# Patient Record
Sex: Female | Born: 1935
Health system: Southern US, Community
[De-identification: ages and names within clinical notes are randomized; demographics above are authoritative.]

## PROBLEM LIST (undated history)

## (undated) DIAGNOSIS — H9319 Tinnitus, unspecified ear: Secondary | ICD-10-CM

## (undated) DIAGNOSIS — F419 Anxiety disorder, unspecified: Secondary | ICD-10-CM

## (undated) DIAGNOSIS — I10 Essential (primary) hypertension: Secondary | ICD-10-CM

## (undated) DIAGNOSIS — K219 Gastro-esophageal reflux disease without esophagitis: Secondary | ICD-10-CM

## (undated) DIAGNOSIS — E079 Disorder of thyroid, unspecified: Secondary | ICD-10-CM

## (undated) DIAGNOSIS — K589 Irritable bowel syndrome without diarrhea: Secondary | ICD-10-CM

## (undated) DIAGNOSIS — R42 Dizziness and giddiness: Secondary | ICD-10-CM

## (undated) DIAGNOSIS — E785 Hyperlipidemia, unspecified: Secondary | ICD-10-CM

## (undated) HISTORY — PX: OTHER SURGICAL HISTORY: SHX169

## (undated) HISTORY — DX: Gastro-esophageal reflux disease without esophagitis: K21.9

## (undated) HISTORY — DX: Irritable bowel syndrome, unspecified: K58.9

## (undated) HISTORY — DX: Disorder of thyroid, unspecified: E07.9

## (undated) HISTORY — DX: Essential (primary) hypertension: I10

## (undated) HISTORY — DX: Tinnitus, unspecified ear: H93.19

## (undated) HISTORY — DX: Hyperlipidemia, unspecified: E78.5

## (undated) HISTORY — DX: Anxiety disorder, unspecified: F41.9

## (undated) HISTORY — PX: ABDOMINAL HYSTERECTOMY: SHX81

---

## 2001-05-13 ENCOUNTER — Emergency Department (HOSPITAL_COMMUNITY): Admission: EM | Admit: 2001-05-13 | Discharge: 2001-05-13 | Payer: Self-pay

## 2008-12-14 ENCOUNTER — Ambulatory Visit: Payer: Self-pay | Admitting: Cardiology

## 2008-12-31 ENCOUNTER — Ambulatory Visit: Payer: Self-pay | Admitting: Cardiology

## 2011-01-30 NOTE — Assessment & Plan Note (Signed)
[facility]                          [facility] OFFICE NOTE   JENIYA, FLANNIGAN                 MRN:          [id number]  DATE:12/31/2008                            DOB:          03-21-1933    PRIMARY CARE Jolyne Laye:  Lindaann Pascal, PAC   REASON FOR VISIT:  Followup hospital consultation.   HISTORY OF PRESENT ILLNESS:  [name] is a 77 year old woman seen  back in late March during the hospital consultation for palpitations.  She has been treated for hypertension with the recent addition of  felodipine by her primary care Jaxten Brosh and to that we added low-dose  metoprolol, which has significantly helped her sense of palpitations.  During her hospital stay, she had no significant dysrhythmias noted.  Her electrocardiogram shows an interventricular conduction delay  (question incomplete left bundle-branch block pattern), and she did  undergo an echocardiogram demonstrating overall normal left ventricular  ejection fraction of 55-60% with mitral bowing, but no frank prolapse  associated with posteriorly directed mitral regurgitation.  She  underwent a screening Cardiolite study, which demonstrated equivocal  left ventricular perfusion with probable variable breast attenuation  artifact leading to a partially reversible anteroapical defect with  overall ejection fraction of 62%.  She has not had frank angina and has  been managed medically including the addition of aspirin.  [name]  states that she has been very anxious about her blood pressure and when  she checks it at home, sometimes her systolics are in the range of 160-  170, but down as low as 130.  She has been tolerating her medications  by report.  She also states that she uses p.r.n. Xanax, which helps her  some.  She has had no further sense of palpitations and has had no  dizziness or syncope.   ALLERGIES:  No known drug allergies.   PRESENT MEDICATIONS:  1. Aspirin  81 mg p.o. daily.  2. Synthroid 88 mcg 1 daily on Monday, Wednesday, and Friday and 0.5      on the other days of the week.  3. Lovastatin 20 mg p.o. q.a.m.  4. Felodipine ER 2.5 mg p.o. q.a.m.  5. Metoprolol 25 mg one-half tablet p.o. b.i.d.   REVIEW OF SYSTEMS:  As outlined above.  Otherwise reviewed and negative.   PHYSICAL EXAMINATION:  VITAL SIGNS:  Blood pressure is 151/73, heart  rate is 60, and weight is 110 pounds.  GENERAL:  This is a thin, but normally nourished-appearing woman in no  acute distress.  HEENT:  Conjunctivae are normal.  Oropharynx is clear.  NECK:  Supple.  No elevated jugular venous pressure.  No loud bruits.  No thyromegaly is noted.  LUNGS:  Clear without breathing at rest.  CARDIAC:  Regular rate and rhythm.  No loud murmur or gallop.  ABDOMEN:  Soft, nontender.  Normoactive bowel sounds.  EXTREMITIES:  Exhibit no significant pitting edema.  Distal pulses are  2+.  SKIN:  Warm and dry.  MUSCULOSKELETAL:  No kyphosis noted.  NEUROPSYCHIATRIC:  The patient is alert x3.  Affect is appropriate.   IMPRESSION:  1. History of  palpitations without clearly delineated dysrhythmia.      These symptoms have improved significantly on low-dose beta-blocker      therapy.  We will plan to continue metoprolol at the present dose.  2. Hypertension, not yet optimally controlled.  I asked Ms. Mesenbrink      to increase her felodipine ER to 5 mg daily.  A new prescription      was provided.  3. We will arrange a followup over the next 4 months for symptom      review and basic cardiac risk assessment.  She is not reporting any      angina at this time.  Her medical regimen includes an aspirin and a      statin medication.  She will, otherwise, continue to see her      primary care Diangelo Radel.     Jonelle Sidle, MD  Electronically Signed    SGM/MedQ  DD: 12/31/2008  DT: 01/01/2009  Job #: [id number]   cc:   Lindaann Pascal, PA

## 2011-03-23 ENCOUNTER — Encounter: Payer: Self-pay | Admitting: Cardiology

## 2013-01-15 ENCOUNTER — Ambulatory Visit (INDEPENDENT_AMBULATORY_CARE_PROVIDER_SITE_OTHER): Payer: Medicare Other | Admitting: General Practice

## 2013-01-15 ENCOUNTER — Encounter: Payer: Self-pay | Admitting: General Practice

## 2013-01-15 VITALS — BP 136/60 | HR 70 | Temp 97.5°F | Ht 62.0 in | Wt 107.0 lb

## 2013-01-15 DIAGNOSIS — T148XXA Other injury of unspecified body region, initial encounter: Secondary | ICD-10-CM

## 2013-01-15 NOTE — Progress Notes (Signed)
  Subjective:    Patient ID: NAVEENA EYMAN, female    DOB: 11/10/1932, 79 y.o.   MRN: [id number]  Neck Pain  This is a new problem. The current episode started in the past 7 days. The problem occurs 2 to 4 times per day. The problem has been unchanged. The pain is associated with nothing. The pain is present in the left side. The quality of the pain is described as aching. The pain is at a severity of 3/10. Exacerbated by: turning neck. The pain is same all the time. Pertinent negatives include no chest pain, headaches or visual change. She has tried acetaminophen (creams) for the symptoms. The treatment provided moderate relief.  Reports working several days a week at [employer] and lifts light weight items. Denies known injury.     Review of Systems  HENT: Positive for neck pain.   Respiratory: Negative for chest tightness and shortness of breath.   Cardiovascular: Negative for chest pain.  Musculoskeletal: Negative for back pain.  Skin: Negative.   Neurological: Negative for dizziness and headaches.       Objective:   Physical Exam  Constitutional: She is oriented to person, place, and time. She appears well-developed and well-nourished.  Cardiovascular: Normal rate, regular rhythm and normal heart sounds.   Pulmonary/Chest: Effort normal and breath sounds normal. No respiratory distress. She exhibits no tenderness.  Musculoskeletal: She exhibits tenderness.  Tenderness to left trapezius muscle with palpation  Neurological: She is alert and oriented to person, place, and time.  Skin: Skin is warm and dry.  Psychiatric: She has a normal mood and affect.          Assessment & Plan:  Take tylenol as directed Apply warm compress to left neck area Rest  Patient verbalized understanding Raymon Mutton, FNP-C

## 2013-01-15 NOTE — Patient Instructions (Addendum)
Muscle Strain °Muscle strain occurs when a muscle is stretched beyond its normal length. A small number of muscle fibers generally are torn. This is especially common in athletes. This happens when a sudden, violent force placed on a muscle stretches it too far. Usually, recovery from muscle strain takes 1 to 2 weeks. Complete healing will take 5 to 6 weeks.  °HOME CARE INSTRUCTIONS  °· While awake, apply ice to the sore muscle for the first 2 days after the injury. °· Put ice in a plastic bag. °· Place a towel between your skin and the bag. °· Leave the ice on for 15 to 20 minutes each hour. °· Do not use the strained muscle for several days, until you no longer have pain. °· You may wrap the injured area with an elastic bandage for comfort. Be careful not to wrap it too tightly. This may interfere with blood circulation or increase swelling. °· Only take over-the-counter or prescription medicines for pain, discomfort, or fever as directed by your caregiver. °SEEK MEDICAL CARE IF:  °You have increasing pain or swelling in the injured area. °MAKE SURE YOU:  °· Understand these instructions. °· Will watch your condition. °· Will get help right away if you are not doing well or get worse. °Document Released: 09/03/2005 Document Revised: 11/26/2011 Document Reviewed: 09/15/2011 °[employer]® Patient Information ©2013 [employer]. ° °

## 2013-02-02 ENCOUNTER — Other Ambulatory Visit: Payer: Self-pay | Admitting: General Practice

## 2013-02-02 DIAGNOSIS — E039 Hypothyroidism, unspecified: Secondary | ICD-10-CM

## 2013-02-02 MED ORDER — LEVOTHYROXINE SODIUM 75 MCG PO TABS: 75.0000 ug | ORAL_TABLET | Freq: Every day | ORAL | Status: DC

## 2013-02-03 NOTE — Progress Notes (Signed)
Pt aware of results. She will call back in 2 months for follow up on labs.

## 2013-02-04 ENCOUNTER — Other Ambulatory Visit: Payer: Self-pay

## 2013-02-04 MED ORDER — METOPROLOL TARTRATE 25 MG PO TABS: ORAL_TABLET | ORAL | Status: DC

## 2013-02-04 MED ORDER — LOVASTATIN 20 MG PO TABS: 20.0000 mg | ORAL_TABLET | Freq: Every day | ORAL | Status: DC

## 2013-03-03 ENCOUNTER — Other Ambulatory Visit: Payer: Self-pay | Admitting: *Deleted

## 2013-03-03 MED ORDER — AMLODIPINE BESYLATE 10 MG PO TABS: 10.0000 mg | ORAL_TABLET | Freq: Every day | ORAL | Status: DC

## 2013-03-03 NOTE — Telephone Encounter (Signed)
Last full check up 02/13

## 2013-03-16 ENCOUNTER — Other Ambulatory Visit: Payer: Self-pay | Admitting: General Practice

## 2013-03-24 ENCOUNTER — Other Ambulatory Visit: Payer: Self-pay | Admitting: General Practice

## 2013-04-03 ENCOUNTER — Ambulatory Visit: Payer: Medicare Other | Admitting: Family Medicine

## 2013-04-09 ENCOUNTER — Telehealth: Payer: Self-pay | Admitting: Family Medicine

## 2013-04-10 ENCOUNTER — Encounter: Payer: Self-pay | Admitting: Family Medicine

## 2013-04-10 ENCOUNTER — Ambulatory Visit (INDEPENDENT_AMBULATORY_CARE_PROVIDER_SITE_OTHER): Payer: Medicare Other | Admitting: Family Medicine

## 2013-04-10 ENCOUNTER — Ambulatory Visit: Payer: Medicare Other | Admitting: Family Medicine

## 2013-04-10 VITALS — BP 122/56 | HR 66 | Temp 97.4°F | Ht 62.0 in | Wt 104.2 lb

## 2013-04-10 DIAGNOSIS — R42 Dizziness and giddiness: Secondary | ICD-10-CM

## 2013-04-10 DIAGNOSIS — H6123 Impacted cerumen, bilateral: Secondary | ICD-10-CM

## 2013-04-10 DIAGNOSIS — R21 Rash and other nonspecific skin eruption: Secondary | ICD-10-CM

## 2013-04-10 DIAGNOSIS — E039 Hypothyroidism, unspecified: Secondary | ICD-10-CM

## 2013-04-10 DIAGNOSIS — H6120 Impacted cerumen, unspecified ear: Secondary | ICD-10-CM

## 2013-04-10 MED ORDER — CARBAMIDE PEROXIDE 6.5 % OT SOLN: 5.0000 [drp] | Freq: Two times a day (BID) | OTIC | Status: DC

## 2013-04-10 MED ORDER — MECLIZINE HCL 25 MG PO TABS: 25.0000 mg | ORAL_TABLET | Freq: Three times a day (TID) | ORAL | Status: DC | PRN

## 2013-04-10 MED ORDER — HYDROCORTISONE 1 % EX OINT: TOPICAL_OINTMENT | Freq: Two times a day (BID) | CUTANEOUS | Status: DC

## 2013-04-10 NOTE — Patient Instructions (Signed)

## 2013-04-10 NOTE — Progress Notes (Signed)
  Subjective:    Patient ID: April Schneider, female    DOB: [birth year], 79 y.o.   MRN: [id number]  HPI  This 79 y.o. female presents for evaluation of vertigo.  She  Has been having these  Sx's for a few days.  She has had this in the past and she took meclizine and this worked. She has been having some ear wax and wants some ear wax gtt's.   Review of Systems No chest pain, SOB, HA, dizziness, vision change, N/V, diarrhea, constipation, dysuria, urinary urgency or frequency, myalgias, arthralgias or rash.     Objective:   Physical Exam  Vital signs noted  Well developed well nourished female.  HEENT - Head atraumatic Normocephalic                Eyes - PERRLA, Conjuctiva - clear Sclera- Clear EOMI                Ears - EAC's with partially obstruction from cerumen, TM's Wnl Gross Hearing WNL                Nose - Nares patent                 Throat - oropharanx wnl Respiratory - Lungs CTA bilateral Cardiac - RRR S1 and S2 without murmur GI - Abdomen soft Nontender and bowel sounds active x 4 Extremities - No edema. Neuro - Grossly intact.      Assessment & Plan:  Hypothyroid - Plan: Thyroid Panel With TSH  Vertigo - Plan: meclizine (ANTIVERT) 25 MG tablet  Cerumen impaction, bilateral - Plan: carbamide peroxide (DEBROX) 6.5 % otic solution

## 2013-04-11 ENCOUNTER — Other Ambulatory Visit: Payer: Self-pay | Admitting: General Practice

## 2013-04-11 DIAGNOSIS — E039 Hypothyroidism, unspecified: Secondary | ICD-10-CM

## 2013-04-11 LAB — THYROID PANEL WITH TSH: T3 Uptake: 34.8 % (ref 22.5–37.0)

## 2013-04-11 MED ORDER — LEVOTHYROXINE SODIUM 50 MCG PO TABS: 50.0000 ug | ORAL_TABLET | Freq: Every day | ORAL | Status: DC

## 2013-04-15 ENCOUNTER — Telehealth: Payer: Self-pay | Admitting: Family Medicine

## 2013-04-16 ENCOUNTER — Telehealth: Payer: Self-pay | Admitting: Family Medicine

## 2013-04-16 ENCOUNTER — Other Ambulatory Visit: Payer: Self-pay | Admitting: General Practice

## 2013-04-16 DIAGNOSIS — R7989 Other specified abnormal findings of blood chemistry: Secondary | ICD-10-CM

## 2013-04-16 NOTE — Telephone Encounter (Signed)
Aware of labs when does she need to come back in and get labs done

## 2013-04-16 NOTE — Telephone Encounter (Signed)
Please inform patient to make appointment for TSH 6 weeks after starting dose. thx

## 2013-04-22 NOTE — Telephone Encounter (Signed)
Pt aware.

## 2013-05-13 ENCOUNTER — Other Ambulatory Visit: Payer: Self-pay | Admitting: General Practice

## 2013-05-20 NOTE — Telephone Encounter (Signed)
MESSAGE DONE °

## 2013-07-17 ENCOUNTER — Other Ambulatory Visit: Payer: Self-pay | Admitting: Family Medicine

## 2013-07-20 ENCOUNTER — Telehealth: Payer: Self-pay | Admitting: General Practice

## 2013-07-20 ENCOUNTER — Other Ambulatory Visit: Payer: Self-pay | Admitting: Family Medicine

## 2013-07-20 NOTE — Telephone Encounter (Signed)
Script for one month of lovastatin called to [facility] pharmacy per Dr. Modesto Charon.  Patient aware to schedule follow up in December.

## 2013-07-22 NOTE — Telephone Encounter (Signed)
LAST LIPID 11/08/11  mAE

## 2013-08-07 ENCOUNTER — Ambulatory Visit (INDEPENDENT_AMBULATORY_CARE_PROVIDER_SITE_OTHER): Payer: Medicare Other | Admitting: Family Medicine

## 2013-08-07 ENCOUNTER — Encounter: Payer: Self-pay | Admitting: *Deleted

## 2013-08-07 ENCOUNTER — Encounter: Payer: Self-pay | Admitting: Family Medicine

## 2013-08-07 VITALS — BP 131/54 | HR 65 | Temp 98.6°F | Ht 62.0 in | Wt 105.0 lb

## 2013-08-07 DIAGNOSIS — J069 Acute upper respiratory infection, unspecified: Secondary | ICD-10-CM

## 2013-08-07 MED ORDER — AZITHROMYCIN 250 MG PO TABS: ORAL_TABLET | ORAL | Status: DC

## 2013-08-07 NOTE — Progress Notes (Signed)
  Subjective:    Patient ID: April Schneider, female    DOB: [birth year], 79 y.o.   MRN: [id number]  HPI URI Symptoms Onset: 1 week  Description: rhinorrhea, nasal congestion, cough  Modifying factors:  None   Symptoms Nasal discharge: yes Fever: no Sore throat: no Cough: yes Wheezing: no Ear pain: no GI symptoms: no Sick contacts: yes  Red Flags  Stiff neck: no Dyspnea: no Rash: no Swallowing difficulty: no  Sinusitis Risk Factors Headache/face pain: no Double sickening: no tooth pain: no  Allergy Risk Factors Sneezing: no Itchy scratchy throat: no Seasonal symptoms: no  Flu Risk Factors Headache: no muscle aches: no severe fatigue: no     Review of Systems  All other systems reviewed and are negative.       Objective:   Physical Exam  Constitutional: She appears well-developed and well-nourished.  HENT:  Head: Normocephalic and atraumatic.  Right Ear: External ear normal.  Left Ear: External ear normal.  +nasal erythema, rhinorrhea bilaterally, + post oropharyngeal erythema    Eyes: Conjunctivae are normal. Pupils are equal, round, and reactive to light.  Neck: Normal range of motion.  Cardiovascular: Normal rate and regular rhythm.   Pulmonary/Chest: Effort normal and breath sounds normal. She has no wheezes. She has no rales.  Abdominal: Soft.  Musculoskeletal: Normal range of motion.  Neurological: She is alert.  Skin: Skin is warm.          Assessment & Plan:  URI (upper respiratory infection) - Plan: azithromycin (ZITHROMAX) 250 MG tablet  Suspect likely viral source of sxs  No cardioresp red flags on exam.  Discussed supportive care and infectious/resp red flags Will given ppx rx for zpak if sxs fail to improve/worsen.  Follow up as needed.

## 2013-08-12 ENCOUNTER — Other Ambulatory Visit: Payer: Self-pay | Admitting: General Practice

## 2013-09-16 ENCOUNTER — Other Ambulatory Visit: Payer: Self-pay | Admitting: General Practice

## 2013-09-21 NOTE — Telephone Encounter (Signed)
Will need an appt after this refill.  Can see PCP

## 2013-09-21 NOTE — Telephone Encounter (Signed)
Last seen 08/07/13  Dr Ernestina Patches  No lipids since [facility]

## 2013-10-05 ENCOUNTER — Telehealth: Payer: Self-pay | Admitting: General Practice

## 2013-10-05 NOTE — Telephone Encounter (Signed)
Patient agreed to come for appointment on Tuesday 20,2014. She could not understand why she had to see a provider to have labwork or get medication refills.  She likes to get labs done at [facility],  (last labs 01-2013).  After explaining insurance and other requirements, she agreed to the appointment. Switchboard then called to inform me,  (within 15 minutes) ,   she had called back to cancel the appointment.

## 2013-10-06 ENCOUNTER — Ambulatory Visit: Payer: Self-pay | Admitting: General Practice

## 2013-10-15 ENCOUNTER — Other Ambulatory Visit: Payer: Self-pay | Admitting: Family Medicine

## 2013-10-25 ENCOUNTER — Other Ambulatory Visit: Payer: Self-pay | Admitting: Nurse Practitioner

## 2013-10-26 ENCOUNTER — Ambulatory Visit (INDEPENDENT_AMBULATORY_CARE_PROVIDER_SITE_OTHER): Payer: Medicare Other | Admitting: General Practice

## 2013-10-26 ENCOUNTER — Encounter: Payer: Self-pay | Admitting: General Practice

## 2013-10-26 VITALS — BP 120/53 | HR 74 | Temp 97.7°F | Ht 62.0 in | Wt 104.0 lb

## 2013-10-26 DIAGNOSIS — Z09 Encounter for follow-up examination after completed treatment for conditions other than malignant neoplasm: Secondary | ICD-10-CM

## 2013-10-26 DIAGNOSIS — E785 Hyperlipidemia, unspecified: Secondary | ICD-10-CM

## 2013-10-26 DIAGNOSIS — F411 Generalized anxiety disorder: Secondary | ICD-10-CM

## 2013-10-26 DIAGNOSIS — E039 Hypothyroidism, unspecified: Secondary | ICD-10-CM

## 2013-10-26 DIAGNOSIS — I10 Essential (primary) hypertension: Secondary | ICD-10-CM

## 2013-10-26 LAB — POCT CBC
Granulocyte percent: 61.4 %G (ref 37–80)
HEMATOCRIT: 37.6 % — AB (ref 37.7–47.9)
Hemoglobin: 12 g/dL — AB (ref 12.2–16.2)
Lymph, poc: 2.1 (ref 0.6–3.4)
MCH: 28.3 pg (ref 27–31.2)
MCHC: 31.9 g/dL (ref 31.8–35.4)
MCV: 88.8 fL (ref 80–97)
MPV: 9.6 fL (ref 0–99.8)
POC Granulocyte: 3.9 (ref 2–6.9)
POC LYMPH PERCENT: 33.3 %L (ref 10–50)
Platelet Count, POC: 177 10*3/uL (ref 142–424)
RBC: 4.2 M/uL (ref 4.04–5.48)
RDW, POC: 13.5 %
WBC: 6.3 10*3/uL (ref 4.6–10.2)

## 2013-10-26 MED ORDER — ALPRAZOLAM 0.25 MG PO TABS: 0.2500 mg | ORAL_TABLET | Freq: Every evening | ORAL | Status: DC | PRN

## 2013-10-26 MED ORDER — LEVOTHYROXINE SODIUM 50 MCG PO TABS
50.0000 ug | ORAL_TABLET | Freq: Every day | ORAL | Status: DC
Start: 2013-10-26 — End: 2013-10-29

## 2013-10-26 MED ORDER — LOVASTATIN 20 MG PO TABS: 20.0000 mg | ORAL_TABLET | Freq: Every day | ORAL | Status: DC

## 2013-10-26 MED ORDER — AMLODIPINE BESYLATE 10 MG PO TABS: 10.0000 mg | ORAL_TABLET | Freq: Every day | ORAL | Status: DC

## 2013-10-26 MED ORDER — METOPROLOL TARTRATE 25 MG PO TABS: 12.5000 mg | ORAL_TABLET | Freq: Every day | ORAL | Status: DC

## 2013-10-26 NOTE — Patient Instructions (Signed)

## 2013-10-26 NOTE — Progress Notes (Signed)
   Subjective:    Patient ID: April Schneider, female    DOB: August 28, 1933, 80 y.o.   MRN: [id number]  HPI Patient presents today for chronic health follow. History of htn, hld, hypothyroidism, anxiety, and vertigo. Reports taking medications as prescribed. Reports eating a healthy diet and working at [employer], 3 days weekly (3 hours).     Review of Systems  Constitutional: Negative for fever and chills.  Respiratory: Negative for chest tightness and shortness of breath.   Cardiovascular: Negative for chest pain and palpitations.  Gastrointestinal: Negative for nausea, vomiting, abdominal pain, diarrhea, constipation and blood in stool.  Neurological: Negative for dizziness, weakness and headaches.       Objective:   Physical Exam  Constitutional: She is oriented to person, place, and time. She appears well-developed and well-nourished.  HENT:  Head: Normocephalic and atraumatic.  Right Ear: External ear normal.  Left Ear: External ear normal.  Neck: Normal range of motion. Neck supple. No thyromegaly present.  Cardiovascular: Normal rate, regular rhythm and normal heart sounds.   Pulmonary/Chest: Effort normal and breath sounds normal. No respiratory distress. She exhibits no tenderness.  Abdominal: Soft. Bowel sounds are normal. She exhibits no distension. There is no tenderness.  Lymphadenopathy:    She has no cervical adenopathy.  Neurological: She is alert and oriented to person, place, and time.  Skin: Skin is warm and dry.  Psychiatric: She has a normal mood and affect.          Assessment & Plan:  1. HTN (hypertension)  - metoprolol tartrate (LOPRESSOR) 25 MG tablet; Take 0.5 tablets (12.5 mg total) by mouth daily.  Dispense: 30 tablet; Refill: 5 - amLODipine (NORVASC) 10 MG tablet; Take 1 tablet (10 mg total) by mouth daily.  Dispense: 30 tablet; Refill: 5 - CMP14+EGFR  2. GAD (generalized anxiety disorder)  - ALPRAZolam (XANAX) 0.25 MG tablet; Take 1  tablet (0.25 mg total) by mouth at bedtime as needed.  Dispense: 30 tablet; Refill: 1  3. HLD (hyperlipidemia)  - lovastatin (MEVACOR) 20 MG tablet; Take 1 tablet (20 mg total) by mouth daily at 6 PM.  Dispense: 30 tablet; Refill: 5 - Lipid panel  4. Hypothyroidism  - levothyroxine (SYNTHROID, LEVOTHROID) 50 MCG tablet; Take 1 tablet (50 mcg total) by mouth daily before breakfast.  Dispense: 30 tablet; Refill: 5 - Thyroid Panel With TSH  5. Follow-up exam, 3-6 months since previous exam  - POCT CBC Continue all current medications Labs pending F/u in 6 months Discussed benefits of regular exercise and healthy eating Patient verbalized understanding Erby Pian, FNP-C

## 2013-10-27 DIAGNOSIS — F411 Generalized anxiety disorder: Secondary | ICD-10-CM | POA: Insufficient documentation

## 2013-10-27 DIAGNOSIS — I10 Essential (primary) hypertension: Secondary | ICD-10-CM | POA: Insufficient documentation

## 2013-10-27 DIAGNOSIS — E785 Hyperlipidemia, unspecified: Secondary | ICD-10-CM | POA: Insufficient documentation

## 2013-10-27 DIAGNOSIS — E039 Hypothyroidism, unspecified: Secondary | ICD-10-CM | POA: Insufficient documentation

## 2013-10-27 LAB — CMP14+EGFR
ALT: 10 IU/L (ref 0–32)
AST: 19 IU/L (ref 0–40)
Albumin/Globulin Ratio: 3.1 — ABNORMAL HIGH (ref 1.1–2.5)
Albumin: 4.7 g/dL (ref 3.5–4.8)
Alkaline Phosphatase: 69 IU/L (ref 39–117)
BUN / CREAT RATIO: 17 (ref 11–26)
BUN: 15 mg/dL (ref 8–27)
CO2: 24 mmol/L (ref 18–29)
Calcium: 9.9 mg/dL (ref 8.7–10.3)
Chloride: 103 mmol/L (ref 97–108)
Creatinine, Ser: 0.88 mg/dL (ref 0.57–1.00)
GFR calc Af Amer: 73 mL/min/{1.73_m2} (ref >59)
GFR, EST NON AFRICAN AMERICAN: 63 mL/min/{1.73_m2} (ref >59)
GLOBULIN, TOTAL: 1.5 g/dL (ref 1.5–4.5)
Glucose: 121 mg/dL — ABNORMAL HIGH (ref 65–99)
POTASSIUM: 4.7 mmol/L (ref 3.5–5.2)
SODIUM: 142 mmol/L (ref 134–144)
Total Bilirubin: 0.3 mg/dL (ref 0.0–1.2)
Total Protein: 6.2 g/dL (ref 6.0–8.5)

## 2013-10-27 LAB — LIPID PANEL
CHOL/HDL RATIO: 2.1 ratio (ref 0.0–4.4)
Cholesterol, Total: 175 mg/dL (ref 100–199)
HDL: 82 mg/dL (ref >39)
LDL CALC: 83 mg/dL (ref 0–99)
Triglycerides: 50 mg/dL (ref 0–149)
VLDL Cholesterol Cal: 10 mg/dL (ref 5–40)

## 2013-10-27 LAB — THYROID PANEL WITH TSH
Free Thyroxine Index: 2.5 (ref 1.2–4.9)
T3 Uptake Ratio: 26 % (ref 24–39)
T4, Total: 9.5 ug/dL (ref 4.5–12.0)
TSH: 6.42 u[IU]/mL — ABNORMAL HIGH (ref 0.450–4.500)

## 2013-10-29 ENCOUNTER — Other Ambulatory Visit: Payer: Self-pay | Admitting: General Practice

## 2013-10-29 DIAGNOSIS — R7989 Other specified abnormal findings of blood chemistry: Secondary | ICD-10-CM

## 2013-10-29 DIAGNOSIS — E039 Hypothyroidism, unspecified: Secondary | ICD-10-CM

## 2013-10-29 MED ORDER — LEVOTHYROXINE SODIUM 75 MCG PO TABS: 75.0000 ug | ORAL_TABLET | Freq: Every day | ORAL | Status: DC

## 2013-11-07 ENCOUNTER — Other Ambulatory Visit: Payer: Self-pay | Admitting: General Practice

## 2013-12-14 ENCOUNTER — Telehealth: Payer: Self-pay | Admitting: General Practice

## 2014-02-18 ENCOUNTER — Encounter: Payer: Self-pay | Admitting: Family

## 2014-02-18 ENCOUNTER — Ambulatory Visit (INDEPENDENT_AMBULATORY_CARE_PROVIDER_SITE_OTHER): Payer: Medicare Other | Admitting: Family

## 2014-02-18 VITALS — BP 117/60 | HR 72 | Temp 97.7°F | Ht 62.0 in | Wt 105.0 lb

## 2014-02-18 DIAGNOSIS — Z1321 Encounter for screening for nutritional disorder: Secondary | ICD-10-CM

## 2014-02-18 DIAGNOSIS — E039 Hypothyroidism, unspecified: Secondary | ICD-10-CM

## 2014-02-18 DIAGNOSIS — I10 Essential (primary) hypertension: Secondary | ICD-10-CM

## 2014-02-18 DIAGNOSIS — R42 Dizziness and giddiness: Secondary | ICD-10-CM

## 2014-02-18 DIAGNOSIS — F411 Generalized anxiety disorder: Secondary | ICD-10-CM

## 2014-02-18 DIAGNOSIS — E785 Hyperlipidemia, unspecified: Secondary | ICD-10-CM

## 2014-02-18 DIAGNOSIS — Z13228 Encounter for screening for other metabolic disorders: Secondary | ICD-10-CM

## 2014-02-18 DIAGNOSIS — Z130 Encounter for screening for diseases of the blood and blood-forming organs and certain disorders involving the immune mechanism: Secondary | ICD-10-CM

## 2014-02-18 DIAGNOSIS — Z1329 Encounter for screening for other suspected endocrine disorder: Secondary | ICD-10-CM

## 2014-02-18 MED ORDER — METOPROLOL TARTRATE 25 MG PO TABS: 12.5000 mg | ORAL_TABLET | Freq: Every day | ORAL | Status: DC

## 2014-02-18 MED ORDER — AMLODIPINE BESYLATE 5 MG PO TABS: 5.0000 mg | ORAL_TABLET | Freq: Every day | ORAL | Status: DC

## 2014-02-18 MED ORDER — ALPRAZOLAM 0.25 MG PO TABS: 0.2500 mg | ORAL_TABLET | Freq: Every evening | ORAL | Status: DC | PRN

## 2014-02-18 MED ORDER — LOVASTATIN 20 MG PO TABS: 20.0000 mg | ORAL_TABLET | Freq: Every day | ORAL | Status: DC

## 2014-02-18 MED ORDER — LEVOTHYROXINE SODIUM 75 MCG PO TABS: 75.0000 ug | ORAL_TABLET | Freq: Every day | ORAL | Status: DC

## 2014-02-18 NOTE — Progress Notes (Signed)
Subjective:    Patient ID: April Schneider, female    DOB: 1932/12/27, 80 y.o.   MRN: [id number]  Hypertension This is a recurrent problem. The current episode started more than 1 year ago. The problem has been resolved since onset. The problem is controlled. Associated symptoms include anxiety. Pertinent negatives include no chest pain, headaches, palpitations or peripheral edema. Risk factors for coronary artery disease include dyslipidemia, post-menopausal state and family history. Past treatments include beta blockers and calcium channel blockers. The current treatment provides significant improvement. There are no compliance problems.  Hypertensive end-organ damage includes a thyroid problem. There is no history of kidney disease or CAD/MI. There is no history of sleep apnea.  Hyperlipidemia This is a chronic problem. The current episode started more than 1 year ago. The problem is controlled. Recent lipid tests were reviewed and are normal. Exacerbating diseases include hypothyroidism. She has no history of diabetes or obesity. Factors aggravating her hyperlipidemia include beta blockers. Pertinent negatives include no chest pain, leg pain or myalgias. Current antihyperlipidemic treatment includes statins. The current treatment provides moderate improvement of lipids. Risk factors for coronary artery disease include dyslipidemia, family history and hypertension.  Thyroid Problem Presents for follow-up visit. Symptoms include anxiety and constipation. Patient reports no cold intolerance, depressed mood, diarrhea, hair loss, palpitations or weight gain. The symptoms have been stable. Past treatments include levothyroxine. The treatment provided moderate relief. Her past medical history is significant for hyperlipidemia. There is no history of diabetes.  Anxiety Presents for follow-up visit. Symptoms include excessive worry. Patient reports no chest pain, depressed mood, insomnia,  nervous/anxious behavior, palpitations or panic. The quality of sleep is good.   There is no history of depression. Past treatments include benzodiazephines. The treatment provided moderate relief. Compliance with prior treatments has been good.      Review of Systems  Constitutional: Negative for weight gain.  HENT: Negative.   Respiratory: Negative.   Cardiovascular: Negative for chest pain and palpitations.  Gastrointestinal: Positive for constipation. Negative for diarrhea.  Endocrine: Negative for cold intolerance.  Genitourinary: Negative.   Musculoskeletal: Negative.  Negative for myalgias.  Neurological: Negative for headaches.  Hematological: Negative.   Psychiatric/Behavioral: The patient is not nervous/anxious and does not have insomnia.   All other systems reviewed and are negative.      Objective:   Physical Exam  Vitals reviewed. Constitutional: She is oriented to person, place, and time. She appears well-developed and well-nourished. No distress.  HENT:  Head: Normocephalic and atraumatic.  Right Ear: External ear normal.  Left Ear: External ear normal.  Mouth/Throat: Oropharynx is clear and moist.  Eyes: Pupils are equal, round, and reactive to light.  Neck: Normal range of motion. Neck supple. No thyromegaly present.  Cardiovascular: Normal rate, regular rhythm, normal heart sounds and intact distal pulses.   No murmur heard. Pulmonary/Chest: Effort normal. No respiratory distress. She has no wheezes.  Diminished breath sounds  Abdominal: Soft. Bowel sounds are normal. She exhibits no distension. There is no tenderness.  Musculoskeletal: Normal range of motion. She exhibits no edema and no tenderness.  Neurological: She is alert and oriented to person, place, and time.  Skin: Skin is warm and dry.  Psychiatric: She has a normal mood and affect. Her behavior is normal. Judgment and thought content normal.      BP 117/60  Pulse 72  Temp(Src) 97.7 F  (36.5 C) (Oral)  Ht '5\' 2"'  (1.575 m)  Wt 105 lb (47.628 kg)  BMI 19.20 kg/m2     Assessment & Plan:  1. HTN (hypertension) - metoprolol tartrate (LOPRESSOR) 25 MG tablet; Take 0.5 tablets (12.5 mg total) by mouth daily.  Dispense: 30 tablet; Refill: 5 - CMP14+EGFR  2. Hypothyroidism - levothyroxine (SYNTHROID, LEVOTHROID) 75 MCG tablet; Take 1 tablet (75 mcg total) by mouth daily before breakfast.  Dispense: 30 tablet; Refill: 5  3. GAD (generalized anxiety disorder) - ALPRAZolam (XANAX) 0.25 MG tablet; Take 1 tablet (0.25 mg total) by mouth at bedtime as needed.  Dispense: 30 tablet; Refill: 1  4. HLD (hyperlipidemia) - lovastatin (MEVACOR) 20 MG tablet; Take 1 tablet (20 mg total) by mouth daily at 6 PM.  Dispense: 30 tablet; Refill: 5 - Lipid panel  5. Vertigo  6. Encounter for vitamin deficiency screening - Vit D  25 hydroxy (rtn osteoporosis monitoring)   Continue all meds Labs pending Health Maintenance reviewed Diet and exercise encouraged RTO 6 months  Evelina Dun, FNP

## 2014-02-18 NOTE — Patient Instructions (Signed)

## 2014-02-19 ENCOUNTER — Telehealth: Payer: Self-pay | Admitting: Family Medicine

## 2014-02-19 LAB — CMP14+EGFR
ALBUMIN: 4.4 g/dL (ref 3.5–4.8)
ALT: 10 IU/L (ref 0–32)
AST: 20 IU/L (ref 0–40)
Albumin/Globulin Ratio: 2.6 — ABNORMAL HIGH (ref 1.1–2.5)
Alkaline Phosphatase: 82 IU/L (ref 39–117)
BUN/Creatinine Ratio: 18 (ref 11–26)
BUN: 15 mg/dL (ref 8–27)
CHLORIDE: 101 mmol/L (ref 97–108)
CO2: 26 mmol/L (ref 18–29)
Calcium: 9.8 mg/dL (ref 8.7–10.3)
Creatinine, Ser: 0.82 mg/dL (ref 0.57–1.00)
GFR calc Af Amer: 79 mL/min/{1.73_m2} (ref >59)
GFR calc non Af Amer: 68 mL/min/{1.73_m2} (ref >59)
GLUCOSE: 121 mg/dL — AB (ref 65–99)
Globulin, Total: 1.7 g/dL (ref 1.5–4.5)
Potassium: 4.9 mmol/L (ref 3.5–5.2)
Sodium: 140 mmol/L (ref 134–144)
TOTAL PROTEIN: 6.1 g/dL (ref 6.0–8.5)
Total Bilirubin: 0.3 mg/dL (ref 0.0–1.2)

## 2014-02-19 LAB — LIPID PANEL
Chol/HDL Ratio: 2.4 ratio units (ref 0.0–4.4)
Cholesterol, Total: 164 mg/dL (ref 100–199)
HDL: 67 mg/dL (ref >39)
LDL CALC: 85 mg/dL (ref 0–99)
TRIGLYCERIDES: 59 mg/dL (ref 0–149)
VLDL CHOLESTEROL CAL: 12 mg/dL (ref 5–40)

## 2014-02-19 LAB — VITAMIN D 25 HYDROXY (VIT D DEFICIENCY, FRACTURES): Vit D, 25-Hydroxy: 39.9 ng/mL (ref 30.0–100.0)

## 2014-02-19 NOTE — Telephone Encounter (Signed)
Message copied by Waverly Ferrari on Fri Feb 19, 2014 10:57 AM ------      Message from: [place], [place] A      Created: Fri Feb 19, 2014  9:59 AM       Kidney and liver function stable      Cholesterol levels WNL      Vit D levels are on low side of normal-Could benefit from Vit D OTC ------

## 2014-02-22 ENCOUNTER — Ambulatory Visit (INDEPENDENT_AMBULATORY_CARE_PROVIDER_SITE_OTHER): Payer: Medicare Other | Admitting: Family Medicine

## 2014-02-22 ENCOUNTER — Encounter: Payer: Self-pay | Admitting: Family Medicine

## 2014-02-22 ENCOUNTER — Telehealth: Payer: Self-pay | Admitting: Family Medicine

## 2014-02-22 VITALS — BP 111/54 | HR 91 | Temp 98.8°F | Ht 62.0 in | Wt 104.0 lb

## 2014-02-22 DIAGNOSIS — J069 Acute upper respiratory infection, unspecified: Secondary | ICD-10-CM

## 2014-02-22 MED ORDER — AZITHROMYCIN 250 MG PO TABS: ORAL_TABLET | ORAL | Status: DC

## 2014-02-22 NOTE — Progress Notes (Signed)
   Subjective:    Patient ID: April Schneider, female    DOB: 12-May-1933, 80 y.o.   MRN: [id number]  HPI This 80 y.o. female presents for evaluation of URI sx's for over a week.   Review of Systems C/o uri No chest pain, SOB, HA, dizziness, vision change, N/V, diarrhea, constipation, dysuria, urinary urgency or frequency, myalgias, arthralgias or rash.     Objective:   Physical Exam  Vital signs noted  Well developed well nourished female.  HEENT - Head atraumatic Normocephalic                Eyes - PERRLA, Conjuctiva - clear Sclera- Clear EOMI                Ears - EAC's Wnl TM's Wnl Gross Hearing WNL                Nose - Nares patent                 Throat - oropharanx wnl Respiratory - Lungs CTA bilateral Cardiac - RRR S1 and S2 without murmur GI - Abdomen soft Nontender and bowel sounds active x 4     Assessment & Plan:  URI (upper respiratory infection) - Plan: azithromycin (ZITHROMAX) 250 MG tablet Push po fluids, rest, tylenol and motrin otc prn as directed for fever, arthralgias, and myalgias.  Follow up prn if sx's continue or persist.  Lysbeth Penner FNP

## 2014-02-22 NOTE — Telephone Encounter (Signed)
appt given for 11 with bill

## 2014-02-26 ENCOUNTER — Other Ambulatory Visit: Payer: Self-pay | Admitting: Family Medicine

## 2014-02-26 ENCOUNTER — Telehealth: Payer: Self-pay | Admitting: Family Medicine

## 2014-02-26 MED ORDER — BENZONATATE 100 MG PO CAPS: 100.0000 mg | ORAL_CAPSULE | Freq: Three times a day (TID) | ORAL | Status: DC | PRN

## 2014-03-15 ENCOUNTER — Telehealth: Payer: Self-pay | Admitting: Family

## 2014-03-15 DIAGNOSIS — E039 Hypothyroidism, unspecified: Secondary | ICD-10-CM

## 2014-03-15 NOTE — Telephone Encounter (Signed)
Patient wants to know about thyroid if it was ordered or not she wants to know about this today she said it was suppose to be done.

## 2014-03-15 NOTE — Telephone Encounter (Signed)
Patient aware.

## 2014-03-15 NOTE — Telephone Encounter (Signed)
Thyroid levels not drawn. Order placed for Thyroid panel. Pt can come anytime in next week to get lab result and we will call her with the results.

## 2014-03-16 ENCOUNTER — Other Ambulatory Visit (INDEPENDENT_AMBULATORY_CARE_PROVIDER_SITE_OTHER): Payer: Medicare Other

## 2014-03-16 DIAGNOSIS — E039 Hypothyroidism, unspecified: Secondary | ICD-10-CM

## 2014-03-16 NOTE — Progress Notes (Signed)
Pt came in for lab  only 

## 2014-03-17 ENCOUNTER — Telehealth: Payer: Self-pay | Admitting: Family Medicine

## 2014-03-17 LAB — THYROID PANEL WITH TSH
FREE THYROXINE INDEX: 2.7 (ref 1.2–4.9)
T3 Uptake Ratio: 28 % (ref 24–39)
T4 TOTAL: 9.6 ug/dL (ref 4.5–12.0)
TSH: 0.069 u[IU]/mL — ABNORMAL LOW (ref 0.450–4.500)

## 2014-03-17 NOTE — Telephone Encounter (Signed)
Message copied by Waverly Ferrari on Wed Mar 17, 2014  3:31 PM ------      Message from: HAWKS, [place] A      Created: Wed Mar 17, 2014  3:16 PM       Thyroid levels stable ------

## 2014-06-29 ENCOUNTER — Telehealth: Payer: Self-pay | Admitting: Family

## 2014-06-29 NOTE — Telephone Encounter (Signed)
Patient advised to purchase B-12 to take daily to help with boosting energy level.

## 2014-06-30 ENCOUNTER — Telehealth: Payer: Self-pay | Admitting: Family

## 2014-06-30 NOTE — Telephone Encounter (Signed)
Wants to get vit b12 otc what mg do you suggest

## 2014-07-01 NOTE — Telephone Encounter (Signed)
Pt can get 1000-2000 mcg PO qd for maintenance treatment

## 2014-07-07 ENCOUNTER — Telehealth: Payer: Self-pay | Admitting: Family Medicine

## 2014-07-07 NOTE — Telephone Encounter (Signed)
appt given with bill [place] Friday @ 2:45

## 2014-07-09 ENCOUNTER — Encounter: Payer: Self-pay | Admitting: Family Medicine

## 2014-07-09 ENCOUNTER — Ambulatory Visit (INDEPENDENT_AMBULATORY_CARE_PROVIDER_SITE_OTHER): Payer: Medicare Other | Admitting: Family Medicine

## 2014-07-09 VITALS — BP 116/66 | HR 69 | Temp 97.2°F | Ht 62.0 in | Wt 104.0 lb

## 2014-07-09 DIAGNOSIS — R5382 Chronic fatigue, unspecified: Secondary | ICD-10-CM

## 2014-07-09 DIAGNOSIS — R002 Palpitations: Secondary | ICD-10-CM

## 2014-07-09 NOTE — Progress Notes (Signed)
   Subjective:    Patient ID: April Schneider, female    DOB: Jan 11, 1933, 80 y.o.   MRN: [id number]  HPI C/o heart palpitations and wants to be checked.  She has hx of PAC's.  She is taking metoprolol.  She has hx of hypothyroidism.  She is having fatigue.  Review of Systems  Constitutional: Negative for fever.  HENT: Negative for ear pain.   Eyes: Negative for discharge.  Respiratory: Negative for cough.   Cardiovascular: Negative for chest pain.  Gastrointestinal: Negative for abdominal distention.  Endocrine: Negative for polyuria.  Genitourinary: Negative for difficulty urinating.  Musculoskeletal: Negative for gait problem and neck pain.  Skin: Negative for color change and rash.  Neurological: Negative for speech difficulty and headaches.  Psychiatric/Behavioral: Negative for agitation.      EKG - NSR w/o acute st-t changes Objective:    BP 116/66  Pulse 69  Temp(Src) 97.2 F (36.2 C) (Oral)  Ht 5\' 2"  (1.575 m)  Wt 104 lb (47.174 kg)  BMI 19.02 kg/m2 Physical Exam  Constitutional: She is oriented to person, place, and time. She appears well-developed and well-nourished.  HENT:  Head: Normocephalic and atraumatic.  Mouth/Throat: Oropharynx is clear and moist.  Eyes: Pupils are equal, round, and reactive to light.  Neck: Normal range of motion. Neck supple.  Cardiovascular: Normal rate and regular rhythm.   No murmur heard. Pulmonary/Chest: Effort normal and breath sounds normal.  Abdominal: Soft. Bowel sounds are normal. There is no tenderness.  Neurological: She is alert and oriented to person, place, and time.  Skin: Skin is warm and dry.  Psychiatric: She has a normal mood and affect.          Assessment & Plan:     ICD-9-CM ICD-10-CM   1. Palpitations 785.1 R00.2 EKG 12-Lead   24 hour holter, TSH, bmp, cbc vit D,b12, and vitamin D.  No Follow-up on file.  Lysbeth Penner FNP

## 2014-07-09 NOTE — Addendum Note (Signed)
Addended by: Earlene Plater on: 07/09/2014 04:58 PM   Modules accepted: Orders

## 2014-07-10 LAB — CMP14+EGFR
ALT: 10 IU/L (ref 0–32)
AST: 20 IU/L (ref 0–40)
Albumin/Globulin Ratio: 2.4 (ref 1.1–2.5)
Albumin: 4.6 g/dL (ref 3.5–4.8)
Alkaline Phosphatase: 82 IU/L (ref 39–117)
BUN/Creatinine Ratio: 13 (ref 11–26)
BUN: 11 mg/dL (ref 8–27)
CO2: 25 mmol/L (ref 18–29)
Calcium: 10.1 mg/dL (ref 8.7–10.3)
Chloride: 100 mmol/L (ref 97–108)
Creatinine, Ser: 0.85 mg/dL (ref 0.57–1.00)
GFR calc Af Amer: 75 mL/min/{1.73_m2} (ref >59)
GFR calc non Af Amer: 65 mL/min/{1.73_m2} (ref >59)
Globulin, Total: 1.9 g/dL (ref 1.5–4.5)
Glucose: 108 mg/dL — ABNORMAL HIGH (ref 65–99)
Potassium: 5.3 mmol/L — ABNORMAL HIGH (ref 3.5–5.2)
Sodium: 141 mmol/L (ref 134–144)
Total Bilirubin: 0.4 mg/dL (ref 0.0–1.2)
Total Protein: 6.5 g/dL (ref 6.0–8.5)

## 2014-07-10 LAB — THYROID PANEL WITH TSH
Free Thyroxine Index: 2.9 (ref 1.2–4.9)
T3 Uptake Ratio: 25 % (ref 24–39)
T4, Total: 11.7 ug/dL (ref 4.5–12.0)
TSH: 0.056 u[IU]/mL — ABNORMAL LOW (ref 0.450–4.500)

## 2014-07-10 LAB — CBC WITH DIFFERENTIAL
Basophils Absolute: 0 10*3/uL (ref 0.0–0.2)
Basos: 1 %
Eos: 3 %
Eosinophils Absolute: 0.2 10*3/uL (ref 0.0–0.4)
HCT: 36.2 % (ref 34.0–46.6)
Hemoglobin: 12.2 g/dL (ref 11.1–15.9)
Immature Grans (Abs): 0 10*3/uL (ref 0.0–0.1)
Immature Granulocytes: 0 %
Lymphocytes Absolute: 1.7 10*3/uL (ref 0.7–3.1)
Lymphs: 24 %
MCH: 29 pg (ref 26.6–33.0)
MCHC: 33.7 g/dL (ref 31.5–35.7)
MCV: 86 fL (ref 79–97)
Monocytes Absolute: 0.5 10*3/uL (ref 0.1–0.9)
Monocytes: 7 %
Neutrophils Absolute: 4.7 10*3/uL (ref 1.4–7.0)
Neutrophils Relative %: 65 %
Platelets: 232 10*3/uL (ref 150–379)
RBC: 4.2 x10E6/uL (ref 3.77–5.28)
RDW: 13.7 % (ref 12.3–15.4)
WBC: 7.2 10*3/uL (ref 3.4–10.8)

## 2014-07-10 LAB — VITAMIN B12: Vitamin B-12: 573 pg/mL (ref 211–946)

## 2014-07-10 LAB — VITAMIN D 25 HYDROXY (VIT D DEFICIENCY, FRACTURES): Vit D, 25-Hydroxy: 48.7 ng/mL (ref 30.0–100.0)

## 2014-07-12 ENCOUNTER — Ambulatory Visit (INDEPENDENT_AMBULATORY_CARE_PROVIDER_SITE_OTHER): Payer: Medicare Other | Admitting: *Deleted

## 2014-07-12 ENCOUNTER — Other Ambulatory Visit: Payer: Self-pay | Admitting: Family Medicine

## 2014-07-12 DIAGNOSIS — R002 Palpitations: Secondary | ICD-10-CM

## 2014-07-12 MED ORDER — LEVOTHYROXINE SODIUM 50 MCG PO TABS: 50.0000 ug | ORAL_TABLET | Freq: Every day | ORAL | Status: DC

## 2014-07-12 NOTE — Progress Notes (Signed)
Patient ID: April Schneider, female   DOB: December 18, 1932, 80 y.o.   MRN: [id number] 24 hr holter monitor placed. Pt to return monitor tom. Pt tolerated well.

## 2014-07-13 ENCOUNTER — Telehealth: Payer: Self-pay | Admitting: Family Medicine

## 2014-07-14 NOTE — Telephone Encounter (Signed)
Patient aware to stay on current dosage

## 2014-07-14 NOTE — Telephone Encounter (Signed)
Called both numbers no answer on either phone left voice mail for patient to return our call.

## 2014-07-21 ENCOUNTER — Other Ambulatory Visit: Payer: Self-pay | Admitting: Family Medicine

## 2014-07-26 ENCOUNTER — Other Ambulatory Visit: Payer: Self-pay | Admitting: Family Medicine

## 2014-07-26 ENCOUNTER — Telehealth: Payer: Self-pay | Admitting: Family Medicine

## 2014-07-26 NOTE — Progress Notes (Signed)
Patient was notified of results on 07/23/2014 by Jacinto Halim MA. Results scanned into epic.

## 2014-07-26 NOTE — Telephone Encounter (Signed)
Please review labs and advise.

## 2014-07-26 NOTE — Telephone Encounter (Signed)
Her TSH was low so I would take 25 mcg or one half of 50 mcg tablets.  Let me know if she needs rx for levothyroxine 25 mcg po qd.  Repeat TSH 6 weeks after starting the levothyroxine 25 mcg po qd.

## 2014-07-27 ENCOUNTER — Telehealth: Payer: Self-pay | Admitting: Family Medicine

## 2014-07-27 NOTE — Telephone Encounter (Signed)
Na answer

## 2014-07-27 NOTE — Telephone Encounter (Signed)
Patient aware to begin levothyroxine 62mcg.  Script has been ready at pharmacy since 07-12-14.

## 2014-08-23 ENCOUNTER — Ambulatory Visit: Payer: Medicare Other | Admitting: Family

## 2014-09-27 ENCOUNTER — Encounter: Payer: Self-pay | Admitting: Family

## 2014-09-27 ENCOUNTER — Ambulatory Visit (INDEPENDENT_AMBULATORY_CARE_PROVIDER_SITE_OTHER): Payer: Medicare Other | Admitting: Family

## 2014-09-27 VITALS — BP 141/62 | HR 68 | Temp 97.0°F | Ht 62.0 in | Wt 104.0 lb

## 2014-09-27 DIAGNOSIS — F411 Generalized anxiety disorder: Secondary | ICD-10-CM

## 2014-09-27 DIAGNOSIS — Z1321 Encounter for screening for nutritional disorder: Secondary | ICD-10-CM

## 2014-09-27 DIAGNOSIS — E785 Hyperlipidemia, unspecified: Secondary | ICD-10-CM | POA: Diagnosis not present

## 2014-09-27 DIAGNOSIS — E039 Hypothyroidism, unspecified: Secondary | ICD-10-CM | POA: Diagnosis not present

## 2014-09-27 DIAGNOSIS — I10 Essential (primary) hypertension: Secondary | ICD-10-CM

## 2014-09-27 MED ORDER — METOPROLOL TARTRATE 25 MG PO TABS: 12.5000 mg | ORAL_TABLET | Freq: Every day | ORAL | Status: DC

## 2014-09-27 MED ORDER — AMLODIPINE BESYLATE 5 MG PO TABS: 5.0000 mg | ORAL_TABLET | Freq: Every day | ORAL | Status: DC

## 2014-09-27 MED ORDER — LOVASTATIN 20 MG PO TABS: 20.0000 mg | ORAL_TABLET | Freq: Every day | ORAL | Status: DC

## 2014-09-27 MED ORDER — ALPRAZOLAM 0.25 MG PO TABS: 0.2500 mg | ORAL_TABLET | Freq: Every evening | ORAL | Status: DC | PRN

## 2014-09-27 MED ORDER — LEVOTHYROXINE SODIUM 50 MCG PO TABS: 50.0000 ug | ORAL_TABLET | Freq: Every day | ORAL | Status: DC

## 2014-09-27 NOTE — Patient Instructions (Signed)
Irritable Bowel Syndrome Irritable bowel syndrome (IBS) is caused by a disturbance of normal bowel function and is a common digestive disorder. You may also hear this condition called spastic colon, mucous colitis, and irritable colon. There is no cure for IBS. However, symptoms often gradually improve or disappear with a good diet, stress management, and medicine. This condition usually appears in late adolescence or early adulthood. Women develop it twice as often as men. CAUSES  After food has been digested and absorbed in the small intestine, waste material is moved into the large intestine, or colon. In the colon, water and salts are absorbed from the undigested products coming from the small intestine. The remaining residue, or fecal material, is held for elimination. Under normal circumstances, gentle, rhythmic contractions of the bowel walls push the fecal material along the colon toward the rectum. In IBS, however, these contractions are irregular and poorly coordinated. The fecal material is either retained too long, resulting in constipation, or expelled too soon, producing diarrhea. SIGNS AND SYMPTOMS  The most common symptom of IBS is abdominal pain. It is often in the lower left side of the abdomen, but it may occur anywhere in the abdomen. The pain comes from spasms of the bowel muscles happening too much and from the buildup of gas and fecal material in the colon. This pain:  Can range from sharp abdominal cramps to a dull, continuous ache.  Often worsens soon after eating.  Is often relieved by having a bowel movement or passing gas. Abdominal pain is usually accompanied by constipation, but it may also produce diarrhea. The diarrhea often occurs right after a meal or upon waking up in the morning. The stools are often soft, watery, and flecked with mucus. Other symptoms of IBS include:  Bloating.  Loss of appetite.  Heartburn.  Backache.  Dull pain in the arms or  shoulders.  Nausea.  Burping.  Vomiting.  Gas. IBS may also cause symptoms that are unrelated to the digestive system, such as:  Fatigue.  Headaches.  Anxiety.  Shortness of breath.  Trouble concentrating.  Dizziness. These symptoms tend to come and go. DIAGNOSIS  The symptoms of IBS may seem like symptoms of other, more serious digestive disorders. Your health care provider may want to perform tests to exclude these disorders.  TREATMENT Many medicines are available to help correct bowel function or relieve bowel spasms and abdominal pain. Among the medicines available are:  Laxatives for severe constipation and to help restore normal bowel habits.  Specific antidiarrheal medicines to treat severe or lasting diarrhea.  Antispasmodic agents to relieve intestinal cramps. Your health care provider may also decide to treat you with a mild tranquilizer or sedative during unusually stressful periods in your life. Your health care provider may also prescribe antidepressant medicine. The use of this medicine has been shown to reduce pain and other symptoms of IBS. Remember that if any medicine is prescribed for you, you should take it exactly as directed. Make sure your health care provider knows how well it worked for you. HOME CARE INSTRUCTIONS   Take all medicines as directed by your health care provider.  Avoid foods that are high in fat or oils, such as heavy cream, butter, frankfurters, sausage, and other fatty meats.  Avoid foods that make you go to the bathroom, such as fruit, fruit juice, and dairy products.  Cut out carbonated drinks, chewing gum, and "gassy" foods such as beans and cabbage. This may help relieve bloating and burping.    Eat foods with bran, and drink plenty of liquids with the bran foods. This helps relieve constipation.  Keep track of what foods seem to bring on your symptoms.  Avoid emotionally charged situations or circumstances that produce  anxiety.  Start or continue exercising.  Get plenty of rest and sleep. Document Released: 09/03/2005 Document Revised: 09/08/2013 Document Reviewed: 04/23/2008 [facility] Patient Information 2015 [place], [place]. This information is not intended to replace advice given to you by your health care provider. Make sure you discuss any questions you have with your health care provider.  Diet and Irritable Bowel Syndrome  No cure has been found for irritable bowel syndrome (IBS). Many options are available to treat the symptoms. Your caregiver will give you the best treatments available for your symptoms. He or she will also encourage you to manage stress and to make changes to your diet. You need to work with your caregiver and Registered Dietician to find the best combination of medicine, diet, counseling, and support to control your symptoms. The following are some diet suggestions. FOODS THAT MAKE IBS WORSE  Fatty foods, such as [place] fries.  Milk products, such as cheese or ice cream.  Chocolate.  Alcohol.  Caffeine (found in coffee and some sodas).  Carbonated drinks, such as soda. If certain foods cause symptoms, you should eat less of them or stop eating them. FOOD JOURNAL   Keep a journal of the foods that seem to cause distress. Write down:  What you are eating during the day and when.  What problems you are having after eating.  When the symptoms occur in relation to your meals.  What foods always make you feel badly.  Take your notes with you to your caregiver to see if you should stop eating certain foods. FOODS THAT MAKE IBS BETTER Fiber reduces IBS symptoms, especially constipation, because it makes stools soft, bulky, and easier to pass. Fiber is found in bran, bread, cereal, beans, fruit, and vegetables. Examples of foods with fiber include:  Apples.  Peaches.  Pears.  Berries.  Figs.  Broccoli, raw.  Cabbage.  Carrots.  Raw peas.  Kidney  beans.  Lima beans.  Whole-grain bread.  Whole-grain cereal. Add foods with fiber to your diet a little at a time. This will let your body get used to them. Too much fiber at once might cause gas and swelling of your abdomen. This can trigger symptoms in a person with IBS. Caregivers usually recommend a diet with enough fiber to produce soft, painless bowel movements. High fiber diets may cause gas and bloating. However, these symptoms often go away within a few weeks, as your body adjusts. In many cases, dietary fiber may lessen IBS symptoms, particularly constipation. However, it may not help pain or diarrhea. High fiber diets keep the colon mildly enlarged (distended) with the added fiber. This may help prevent spasms in the colon. Some forms of fiber also keep water in the stool, thereby preventing hard stools that are difficult to pass.  Besides telling you to eat more foods with fiber, your caregiver may also tell you to get more fiber by taking a fiber pill or drinking water mixed with a special high fiber powder. An example of this is a natural fiber laxative containing psyllium seed.  TIPS  Large meals can cause cramping and diarrhea in people with IBS. If this happens to you, try eating 4 or 5 small meals a day, or try eating less at each of your usual 3  meals. It may also help if your meals are low in fat and high in carbohydrates. Examples of carbohydrates are pasta, rice, whole-grain breads and cereals, fruits, and vegetables.  If dairy products cause your symptoms to flare up, you can try eating less of those foods. You might be able to handle yogurt better than other dairy products, because it contains bacteria that helps with digestion. Dairy products are an important source of calcium and other nutrients. If you need to avoid dairy products, be sure to talk with a Registered Dietitian about getting these nutrients through other food sources.  Drink enough water and fluids to keep  your urine clear or pale yellow. This is important, especially if you have diarrhea. FOR MORE INFORMATION  International Foundation for Functional Gastrointestinal Disorders: www.iffgd.org  National Digestive Diseases Information Clearinghouse: digestive.AmenCredit.is Document Released: 11/24/2003 Document Revised: 11/26/2011 Document Reviewed: 12/04/2013 [facility] Patient Information 2015 [place], [place]. This information is not intended to replace advice given to you by your health care provider. Make sure you discuss any questions you have with your health care provider.

## 2014-09-27 NOTE — Progress Notes (Signed)
Subjective:    Patient ID: April Schneider, female    DOB: Sep 29, 1932, 81 y.o.   MRN: [id number]  Hypertension This is a chronic problem. The current episode started more than 1 year ago. The problem has been waxing and waning since onset. The problem is uncontrolled. Associated symptoms include anxiety. Pertinent negatives include no blurred vision, headaches, malaise/fatigue, palpitations, peripheral edema or shortness of breath. Risk factors for coronary artery disease include dyslipidemia and post-menopausal state. Past treatments include calcium channel blockers and beta blockers. The current treatment provides moderate improvement. Hypertensive end-organ damage includes a thyroid problem. There is no history of kidney disease, CAD/MI, CVA or heart failure. There is no history of sleep apnea.  Hyperlipidemia This is a chronic problem. The current episode started more than 1 year ago. The problem is controlled. Recent lipid tests were reviewed and are normal. Exacerbating diseases include hypothyroidism. She has no history of diabetes or obesity. Pertinent negatives include no leg pain, myalgias or shortness of breath. Current antihyperlipidemic treatment includes statins. The current treatment provides moderate improvement of lipids. There are no compliance problems.  Risk factors for coronary artery disease include dyslipidemia, hypertension, post-menopausal, a sedentary lifestyle and family history.  Thyroid Problem Presents for follow-up visit. Symptoms include anxiety. Patient reports no constipation, depressed mood, diarrhea, hair loss, leg swelling or palpitations. The symptoms have been stable. Past treatments include levothyroxine. The treatment provided significant relief. Her past medical history is significant for hyperlipidemia. There is no history of diabetes or heart failure.  Anxiety Presents for follow-up visit. Symptoms include nervous/anxious behavior and panic. Patient  reports no depressed mood, excessive worry, hyperventilation, irritability, palpitations or shortness of breath. Symptoms occur occasionally.   Her past medical history is significant for anxiety/panic attacks. There is no history of depression. Past treatments include benzodiazephines.      Review of Systems  Constitutional: Negative.  Negative for malaise/fatigue and irritability.  HENT: Negative.   Eyes: Negative.  Negative for blurred vision.  Respiratory: Negative.  Negative for shortness of breath.   Cardiovascular: Negative.  Negative for palpitations.  Gastrointestinal: Negative.  Negative for diarrhea and constipation.  Endocrine: Negative.   Genitourinary: Negative.   Musculoskeletal: Negative.  Negative for myalgias.  Neurological: Negative.  Negative for headaches.  Hematological: Negative.   Psychiatric/Behavioral: The patient is nervous/anxious.   All other systems reviewed and are negative.      Objective:   Physical Exam  Constitutional: She is oriented to person, place, and time. She appears well-developed and well-nourished. No distress.  HENT:  Head: Normocephalic and atraumatic.  Right Ear: External ear normal.  Left Ear: External ear normal.  Nose: Nose normal.  Mouth/Throat: Oropharynx is clear and moist.  Eyes: Pupils are equal, round, and reactive to light.  Neck: Normal range of motion. Neck supple. No thyromegaly present.  Cardiovascular: Normal rate, regular rhythm, normal heart sounds and intact distal pulses.   No murmur heard. Pulmonary/Chest: Effort normal and breath sounds normal. No respiratory distress. She has no wheezes.  Abdominal: Soft. Bowel sounds are normal. She exhibits no distension. There is no tenderness.  Musculoskeletal: Normal range of motion. She exhibits no edema or tenderness.  Neurological: She is alert and oriented to person, place, and time. She has normal reflexes. No cranial nerve deficit.  Skin: Skin is warm and dry.    Psychiatric: She has a normal mood and affect. Her behavior is normal. Judgment and thought content normal.  Vitals reviewed.   BP 141/62  mmHg  Pulse 68  Temp(Src) 97 F (36.1 C) (Oral)  Ht '5\' 2"'  (1.575 m)  Wt 104 lb (47.174 kg)  BMI 19.02 kg/m2       Assessment & Plan:  1. GAD (generalized anxiety disorder) - CMP14+EGFR - ALPRAZolam (XANAX) 0.25 MG tablet; Take 1 tablet (0.25 mg total) by mouth at bedtime as needed.  Dispense: 30 tablet; Refill: 1  2. HLD (hyperlipidemia) - CMP14+EGFR - Lipid panel - lovastatin (MEVACOR) 20 MG tablet; Take 1 tablet (20 mg total) by mouth daily at 6 PM.  Dispense: 90 tablet; Refill: 3  3. Hypothyroidism, unspecified hypothyroidism type - CMP14+EGFR - Thyroid Panel With TSH - levothyroxine (SYNTHROID, LEVOTHROID) 50 MCG tablet; Take 1 tablet (50 mcg total) by mouth daily.  Dispense: 90 tablet; Refill: 3  4. Essential hypertension - CMP14+EGFR - amLODipine (NORVASC) 5 MG tablet; Take 1 tablet (5 mg total) by mouth daily.  Dispense: 90 tablet; Refill: 3 - metoprolol tartrate (LOPRESSOR) 25 MG tablet; Take 0.5 tablets (12.5 mg total) by mouth daily.  Dispense: 90 tablet; Refill: 3  5. Encounter for vitamin deficiency screening - Vit D  25 hydroxy (rtn osteoporosis monitoring)   Continue all meds Labs pending Health Maintenance reviewed Diet and exercise encouraged RTO 6 months  Evelina Dun, FNP

## 2014-09-28 LAB — CMP14+EGFR
ALK PHOS: 83 IU/L (ref 39–117)
ALT: 13 IU/L (ref 0–32)
AST: 24 IU/L (ref 0–40)
Albumin/Globulin Ratio: 2.6 — ABNORMAL HIGH (ref 1.1–2.5)
Albumin: 4.5 g/dL (ref 3.5–4.8)
BILIRUBIN TOTAL: 0.3 mg/dL (ref 0.0–1.2)
BUN / CREAT RATIO: 11 (ref 11–26)
BUN: 9 mg/dL (ref 8–27)
CO2: 28 mmol/L (ref 18–29)
Calcium: 9.8 mg/dL (ref 8.7–10.3)
Chloride: 102 mmol/L (ref 97–108)
Creatinine, Ser: 0.85 mg/dL (ref 0.57–1.00)
GFR calc non Af Amer: 65 mL/min/{1.73_m2} (ref >59)
GFR, EST AFRICAN AMERICAN: 75 mL/min/{1.73_m2} (ref >59)
Globulin, Total: 1.7 g/dL (ref 1.5–4.5)
Glucose: 97 mg/dL (ref 65–99)
POTASSIUM: 5 mmol/L (ref 3.5–5.2)
Sodium: 143 mmol/L (ref 134–144)
Total Protein: 6.2 g/dL (ref 6.0–8.5)

## 2014-09-28 LAB — VITAMIN D 25 HYDROXY (VIT D DEFICIENCY, FRACTURES): Vit D, 25-Hydroxy: 34 ng/mL (ref 30.0–100.0)

## 2014-09-28 LAB — THYROID PANEL WITH TSH
Free Thyroxine Index: 2.3 (ref 1.2–4.9)
T3 Uptake Ratio: 26 % (ref 24–39)
T4 TOTAL: 8.9 ug/dL (ref 4.5–12.0)
TSH: 2.9 u[IU]/mL (ref 0.450–4.500)

## 2014-09-28 LAB — LIPID PANEL
CHOLESTEROL TOTAL: 181 mg/dL (ref 100–199)
Chol/HDL Ratio: 2.4 ratio units (ref 0.0–4.4)
HDL: 74 mg/dL (ref >39)
LDL CALC: 91 mg/dL (ref 0–99)
Triglycerides: 80 mg/dL (ref 0–149)
VLDL Cholesterol Cal: 16 mg/dL (ref 5–40)

## 2014-10-12 ENCOUNTER — Telehealth: Payer: Self-pay | Admitting: Family

## 2014-10-12 DIAGNOSIS — H9201 Otalgia, right ear: Secondary | ICD-10-CM

## 2014-10-13 NOTE — Telephone Encounter (Signed)
Patient is having trouble with her right ear and can not hear out of it and she wants to get referral back to [facility]

## 2014-10-13 NOTE — Telephone Encounter (Signed)
Referral sent per pt's request

## 2014-10-15 ENCOUNTER — Encounter: Payer: Self-pay | Admitting: Family

## 2014-10-15 ENCOUNTER — Ambulatory Visit (INDEPENDENT_AMBULATORY_CARE_PROVIDER_SITE_OTHER): Payer: Medicare Other | Admitting: Family

## 2014-10-15 VITALS — BP 123/63 | HR 70 | Temp 97.5°F | Ht 62.0 in | Wt 103.0 lb

## 2014-10-15 DIAGNOSIS — H6123 Impacted cerumen, bilateral: Secondary | ICD-10-CM

## 2014-10-15 DIAGNOSIS — H938X3 Other specified disorders of ear, bilateral: Secondary | ICD-10-CM

## 2014-10-15 DIAGNOSIS — L089 Local infection of the skin and subcutaneous tissue, unspecified: Secondary | ICD-10-CM | POA: Diagnosis not present

## 2014-10-15 MED ORDER — MUPIROCIN 2 % EX OINT: 1.0000 "application " | TOPICAL_OINTMENT | Freq: Two times a day (BID) | CUTANEOUS | Status: DC

## 2014-10-15 NOTE — Patient Instructions (Signed)
Cerumen Impaction °A cerumen impaction is when the wax in your ear forms a plug. This plug usually causes reduced hearing. Sometimes it also causes an earache or dizziness. Removing a cerumen impaction can be difficult and painful. The wax sticks to the ear canal. The canal is sensitive and bleeds easily. If you try to remove a heavy wax buildup with a cotton tipped swab, you may push it in further. °Irrigation with water, suction, and small ear curettes may be used to clear out the wax. If the impaction is fixed to the skin in the ear canal, ear drops may be needed for a few days to loosen the wax. People who build up a lot of wax frequently can use ear wax removal products available in your local drugstore. °SEEK MEDICAL CARE IF:  °You develop an earache, increased hearing loss, or marked dizziness. °Document Released: 10/11/2004 Document Revised: 11/26/2011 Document Reviewed: 12/01/2009 °ExitCare® Patient Information ©2015 [employer]. This information is not intended to replace advice given to you by your health care provider. Make sure you discuss any questions you have with your health care provider. ° °

## 2014-10-15 NOTE — Progress Notes (Signed)
   Subjective:    Patient ID: April Schneider, female    DOB: April 17, 1933, 81 y.o.   MRN: [id number]  Hand Pain  The incident occurred 3 to 5 days ago. The incident occurred at home. Injury mechanism: Pt's right index finger, pt filed her finger nail and caused the inner finger to become red. The quality of the pain is described as aching. The pain is at a severity of 4/10. The pain is mild. The pain has been intermittent since the incident. She has tried rest for the symptoms. The treatment provided mild relief.  Ear Fullness  There is pain in both ears. This is a recurrent problem. The current episode started 1 to 4 weeks ago. The problem occurs constantly. There has been no fever. The patient is experiencing no pain. Associated symptoms include hearing loss. Pertinent negatives include no coughing, ear discharge, headaches or sore throat. She has tried ear drops for the symptoms.      Review of Systems  Constitutional: Negative.   HENT: Positive for hearing loss. Negative for ear discharge and sore throat.   Eyes: Negative.   Respiratory: Negative.  Negative for cough and shortness of breath.   Cardiovascular: Negative.  Negative for palpitations.  Gastrointestinal: Negative.   Endocrine: Negative.   Genitourinary: Negative.   Musculoskeletal: Negative.   Neurological: Negative.  Negative for headaches.  Hematological: Negative.   Psychiatric/Behavioral: Negative.   All other systems reviewed and are negative.      Objective:   Physical Exam  Constitutional: She is oriented to person, place, and time. She appears well-developed and well-nourished. No distress.  HENT:  Head: Normocephalic and atraumatic.  Right Ear: There is swelling and tenderness.  Left Ear: There is tenderness.  Eyes: Pupils are equal, round, and reactive to light.  Neck: Normal range of motion. Neck supple. No thyromegaly present.  Cardiovascular: Normal rate, regular rhythm, normal heart sounds and  intact distal pulses.   No murmur heard. Pulmonary/Chest: Effort normal and breath sounds normal. No respiratory distress. She has no wheezes.  Abdominal: Soft. Bowel sounds are normal. She exhibits no distension. There is no tenderness.  Musculoskeletal: Normal range of motion. She exhibits no edema or tenderness.  Neurological: She is alert and oriented to person, place, and time. She has normal reflexes. No cranial nerve deficit.  Skin: Skin is warm and dry. There is erythema.  Right index finger tip slightly erythemas   Psychiatric: She has a normal mood and affect. Her behavior is normal. Judgment and thought content normal.  Vitals reviewed.     BP 123/63 mmHg  Pulse 70  Temp(Src) 97.5 F (36.4 C) (Oral)  Ht 5\' 2"  (1.575 m)  Wt 103 lb (46.72 kg)  BMI 18.83 kg/m2     Assessment & Plan:  1. Cerumen impaction, bilateral -Do not pick at ear -Ear wax drops as needed - Ambulatory referral to ENT  2. Ear fullness, bilateral  3. Infection of index finger -Keep clean and dry -RTO if becomes worse -S/S of infection discussed - mupirocin ointment (BACTROBAN) 2 %; Place 1 application into the nose 2 (two) times daily.  Dispense: 22 g; Refill: 0  Evelina Dun, FNP

## 2014-10-19 DIAGNOSIS — H6123 Impacted cerumen, bilateral: Secondary | ICD-10-CM | POA: Diagnosis not present

## 2014-10-19 DIAGNOSIS — H902 Conductive hearing loss, unspecified: Secondary | ICD-10-CM | POA: Diagnosis not present

## 2014-11-23 ENCOUNTER — Ambulatory Visit (INDEPENDENT_AMBULATORY_CARE_PROVIDER_SITE_OTHER): Payer: Medicare Other | Admitting: Nurse Practitioner

## 2014-11-23 ENCOUNTER — Encounter: Payer: Self-pay | Admitting: Nurse Practitioner

## 2014-11-23 VITALS — BP 114/56 | HR 74 | Temp 98.2°F | Ht 62.0 in | Wt 104.2 lb

## 2014-11-23 DIAGNOSIS — J069 Acute upper respiratory infection, unspecified: Secondary | ICD-10-CM | POA: Diagnosis not present

## 2014-11-23 MED ORDER — BENZONATATE 100 MG PO CAPS: 100.0000 mg | ORAL_CAPSULE | Freq: Three times a day (TID) | ORAL | Status: DC | PRN

## 2014-11-23 NOTE — Patient Instructions (Addendum)
1. Take meds as prescribed 2. Use a cool mist humidifier especially during the winter months and when heat has been humid. 3. Use saline nose sprays frequently 4. Saline irrigations of the nose can be very helpful if done frequently.  * 4X daily for 1 week*  * Use of a nettie pot can be helpful with this. Follow directions with this* 5. Drink plenty of fluids 6. Keep thermostat turn down low 7.For any cough or congestion  Use plain Mucinex- regular strength or max strength is fine   * Children- consult with Pharmacist for dosing 8. For fever or aces or pains- take tylenol or ibuprofen appropriate for age and weight.  * for fevers greater than 101 orally you may alternate ibuprofen and tylenol every  3 hours.   Mary-Margaret Hassell Done, FNP

## 2014-11-23 NOTE — Progress Notes (Signed)
  Subjective:     April Schneider is a 81 y.o. female who presents for evaluation of sore throat. Associated symptoms include nasal blockage, post nasal drip, productive cough, sinus and nasal congestion and sneezing. Onset of symptoms was 5 days ago, and have been unchanged since that time. She is drinking plenty of fluids. She has had a recent close exposure to someone with proven streptococcal pharyngitis.  The following portions of the patient's history were reviewed and updated as appropriate: allergies, current medications, past family history, past medical history, past social history, past surgical history and problem list.  Review of Systems Pertinent items are noted in HPI.    Objective:    BP 114/56 mmHg  Pulse 74  Temp(Src) 98.2 F (36.8 C) (Oral)  Ht 5\' 2"  (1.575 m)  Wt 104 lb 3.2 oz (47.265 kg)  BMI 19.05 kg/m2 General appearance: alert, cooperative and appears stated age Ears: normal TM's and external ear canals both ears Nose: Nares normal. Septum midline. Mucosa normal. No drainage or sinus tenderness. Throat: lips, mucosa, and tongue normal; teeth and gums normal Lungs: clear to auscultation bilaterally Heart: regular rate and rhythm, S1, S2 normal, no murmur, click, rub or gallop    Assessment:    Acute pharyngitis, likely  Viral pharyngitis.    Plan:   1. Acute URI    Meds ordered this encounter  Medications  . benzonatate (TESSALON PERLES) 100 MG capsule    Sig: Take 1 capsule (100 mg total) by mouth 3 (three) times daily as needed for cough.    Dispense:  20 capsule    Refill:  0    Order Specific Question:  Supervising Provider    Answer:  Chipper Herb [[id number]]    1. Take meds as prescribed 2. Use a cool mist humidifier especially during the winter months and when heat has been humid. 3. Use saline nose sprays frequently 4. Saline irrigations of the nose can be very helpful if done frequently.  * 4X daily for 1 week*  * Use of a nettie  pot can be helpful with this. Follow directions with this* 5. Drink plenty of fluids 6. Keep thermostat turn down low 7.For any cough or congestion  Use plain Mucinex- regular strength or max strength is fine   * Children- consult with Pharmacist for dosing 8. For fever or aces or pains- take tylenol or ibuprofen appropriate for age and weight.  * for fevers greater than 101 orally you may alternate ibuprofen and tylenol every  3 hours.   RTO PRN  Mary-Margaret Hassell Done, FNP

## 2015-01-14 ENCOUNTER — Telehealth: Payer: Self-pay | Admitting: Family

## 2015-01-14 NOTE — Telephone Encounter (Signed)
Can follow up in July.  All labs were good last visit.

## 2015-03-22 ENCOUNTER — Telehealth: Payer: Self-pay | Admitting: Family

## 2015-03-22 NOTE — Telephone Encounter (Signed)
Spoke with husband and he will relay message to patient. She was seen in 09/2014 and needs to be seen this month for 6 month f/u.

## 2015-04-07 DIAGNOSIS — H40033 Anatomical narrow angle, bilateral: Secondary | ICD-10-CM | POA: Diagnosis not present

## 2015-04-07 DIAGNOSIS — H2513 Age-related nuclear cataract, bilateral: Secondary | ICD-10-CM | POA: Diagnosis not present

## 2015-04-14 ENCOUNTER — Ambulatory Visit (INDEPENDENT_AMBULATORY_CARE_PROVIDER_SITE_OTHER): Payer: Medicare Other | Admitting: Family

## 2015-04-14 ENCOUNTER — Encounter: Payer: Self-pay | Admitting: Family

## 2015-04-14 VITALS — BP 142/65 | HR 65 | Temp 97.7°F | Ht 62.0 in | Wt 104.2 lb

## 2015-04-14 DIAGNOSIS — Z1321 Encounter for screening for nutritional disorder: Secondary | ICD-10-CM | POA: Diagnosis not present

## 2015-04-14 DIAGNOSIS — E785 Hyperlipidemia, unspecified: Secondary | ICD-10-CM

## 2015-04-14 DIAGNOSIS — E039 Hypothyroidism, unspecified: Secondary | ICD-10-CM

## 2015-04-14 DIAGNOSIS — F411 Generalized anxiety disorder: Secondary | ICD-10-CM

## 2015-04-14 DIAGNOSIS — I10 Essential (primary) hypertension: Secondary | ICD-10-CM

## 2015-04-14 DIAGNOSIS — H04123 Dry eye syndrome of bilateral lacrimal glands: Secondary | ICD-10-CM | POA: Diagnosis not present

## 2015-04-14 NOTE — Progress Notes (Signed)
Subjective:    Patient ID: April Schneider, female    DOB: 1933/06/12, 81 y.o.   MRN: [id number]  Pt presents to the office today for chronic follow up.  Hypertension This is a chronic problem. The current episode started more than 1 year ago. The problem has been waxing and waning since onset. The problem is uncontrolled. Associated symptoms include anxiety. Pertinent negatives include no blurred vision, headaches, malaise/fatigue, palpitations, peripheral edema or shortness of breath. Risk factors for coronary artery disease include dyslipidemia and post-menopausal state. Past treatments include calcium channel blockers and beta blockers. The current treatment provides moderate improvement. Hypertensive end-organ damage includes a thyroid problem. There is no history of kidney disease, CAD/MI, CVA or heart failure. There is no history of sleep apnea.  Hyperlipidemia This is a chronic problem. The current episode started more than 1 year ago. The problem is controlled. Recent lipid tests were reviewed and are normal. Exacerbating diseases include hypothyroidism. She has no history of diabetes or obesity. Pertinent negatives include no leg pain, myalgias or shortness of breath. Current antihyperlipidemic treatment includes statins. The current treatment provides moderate improvement of lipids. There are no compliance problems.  Risk factors for coronary artery disease include dyslipidemia, hypertension, post-menopausal, a sedentary lifestyle and family history.  Thyroid Problem Presents for follow-up visit. Symptoms include anxiety. Patient reports no constipation, depressed mood, diarrhea, hair loss, leg swelling or palpitations. The symptoms have been stable. Past treatments include levothyroxine. The treatment provided significant relief. Her past medical history is significant for hyperlipidemia. There is no history of diabetes or heart failure.  Anxiety Presents for follow-up visit. Symptoms  include nervous/anxious behavior and panic. Patient reports no depressed mood, excessive worry, hyperventilation, irritability, palpitations or shortness of breath. Symptoms occur occasionally.   Her past medical history is significant for anxiety/panic attacks. There is no history of depression. Past treatments include benzodiazephines.      Review of Systems  Constitutional: Negative.  Negative for malaise/fatigue and irritability.  HENT: Negative.   Eyes: Negative.  Negative for blurred vision.  Respiratory: Negative.  Negative for shortness of breath.   Cardiovascular: Negative.  Negative for palpitations.  Gastrointestinal: Negative.  Negative for diarrhea and constipation.  Endocrine: Negative.   Genitourinary: Negative.   Musculoskeletal: Negative.  Negative for myalgias.  Neurological: Negative.  Negative for headaches.  Hematological: Negative.   Psychiatric/Behavioral: The patient is nervous/anxious.   All other systems reviewed and are negative.      Objective:   Physical Exam  Constitutional: She is oriented to person, place, and time. She appears well-developed and well-nourished. No distress.  HENT:  Head: Normocephalic and atraumatic.  Right Ear: External ear normal.  Left Ear: External ear normal.  Nose: Nose normal.  Mouth/Throat: Oropharynx is clear and moist.  Eyes: Pupils are equal, round, and reactive to light.  Neck: Normal range of motion. Neck supple. No thyromegaly present.  Cardiovascular: Normal rate, regular rhythm, normal heart sounds and intact distal pulses.   No murmur heard. Pulmonary/Chest: Effort normal and breath sounds normal. No respiratory distress. She has no wheezes.  Abdominal: Soft. Bowel sounds are normal. She exhibits no distension. There is no tenderness.  Musculoskeletal: Normal range of motion. She exhibits no edema or tenderness.  Neurological: She is alert and oriented to person, place, and time. She has normal reflexes. No  cranial nerve deficit.  Skin: Skin is warm and dry.  Psychiatric: She has a normal mood and affect. Her behavior is normal. Judgment and  thought content normal.  Vitals reviewed.   BP 142/65 mmHg  Pulse 65  Temp(Src) 97.7 F (36.5 C) (Oral)  Ht _0  (1.575 m)  Wt 104 lb 3.2 oz (47.265 kg)  BMI 19.05 kg/m2       Assessment & Plan:  1. Essential hypertension - CMP14+EGFR  2. Hypothyroidism, unspecified hypothyroidism type - CMP14+EGFR - Thyroid Panel With TSH  3. GAD (generalized anxiety disorder) - CMP14+EGFR  4. HLD (hyperlipidemia) - CMP14+EGFR - Lipid panel  5. Encounter for vitamin deficiency screening - CMP14+EGFR - Vit D  25 hydroxy (rtn osteoporosis monitoring)   Continue all meds Labs pending Health Maintenance reviewed Diet and exercise encouraged RTO 6 months  Evelina Dun, FNP

## 2015-04-14 NOTE — Addendum Note (Signed)
Addended by: Earlene Plater on: 04/14/2015 04:44 PM   Modules accepted: Miquel Dunn

## 2015-04-14 NOTE — Patient Instructions (Signed)

## 2015-04-15 LAB — LIPID PANEL
CHOLESTEROL TOTAL: 156 mg/dL (ref 100–199)
Chol/HDL Ratio: 2.2 ratio units (ref 0.0–4.4)
HDL: 72 mg/dL (ref >39)
LDL CALC: 71 mg/dL (ref 0–99)
TRIGLYCERIDES: 64 mg/dL (ref 0–149)
VLDL Cholesterol Cal: 13 mg/dL (ref 5–40)

## 2015-04-15 LAB — VITAMIN D 25 HYDROXY (VIT D DEFICIENCY, FRACTURES): Vit D, 25-Hydroxy: 38.4 ng/mL (ref 30.0–100.0)

## 2015-04-15 LAB — CMP14+EGFR
A/G RATIO: 2.3 (ref 1.1–2.5)
ALT: 8 IU/L (ref 0–32)
AST: 17 IU/L (ref 0–40)
Albumin: 4.4 g/dL (ref 3.5–4.7)
Alkaline Phosphatase: 78 IU/L (ref 39–117)
BILIRUBIN TOTAL: 0.3 mg/dL (ref 0.0–1.2)
BUN / CREAT RATIO: 12 (ref 11–26)
BUN: 10 mg/dL (ref 8–27)
CALCIUM: 9.7 mg/dL (ref 8.7–10.3)
CO2: 26 mmol/L (ref 18–29)
CREATININE: 0.86 mg/dL (ref 0.57–1.00)
Chloride: 100 mmol/L (ref 97–108)
GFR calc non Af Amer: 64 mL/min/{1.73_m2} (ref >59)
GFR, EST AFRICAN AMERICAN: 74 mL/min/{1.73_m2} (ref >59)
GLOBULIN, TOTAL: 1.9 g/dL (ref 1.5–4.5)
GLUCOSE: 107 mg/dL — AB (ref 65–99)
POTASSIUM: 4.8 mmol/L (ref 3.5–5.2)
Sodium: 141 mmol/L (ref 134–144)
Total Protein: 6.3 g/dL (ref 6.0–8.5)

## 2015-04-15 LAB — THYROID PANEL WITH TSH
Free Thyroxine Index: 2.2 (ref 1.2–4.9)
T3 UPTAKE RATIO: 26 % (ref 24–39)
T4, Total: 8.5 ug/dL (ref 4.5–12.0)
TSH: 1.51 u[IU]/mL (ref 0.450–4.500)

## 2015-05-31 ENCOUNTER — Telehealth: Payer: Self-pay | Admitting: Family

## 2015-05-31 NOTE — Telephone Encounter (Signed)
Patient aware and appointment given.  

## 2015-05-31 NOTE — Telephone Encounter (Signed)
Patient wants to know if she can cut back on the metoprolol or the amlodipine. She states that she is extremely tired, leg cramps and fatigue

## 2015-05-31 NOTE — Telephone Encounter (Signed)
Pt to stop metoprolol and RTO in 1-2 weeks to recheck BP

## 2015-06-03 ENCOUNTER — Telehealth: Payer: Self-pay | Admitting: Family

## 2015-06-03 NOTE — Telephone Encounter (Signed)
Patient initiated this change when she called stating that she was more tired and weaker than normal and that her pharmacist told her that it might be her blood pressure medications. She has decided to go back to taking both medications, amlodipine and metoprolol. She has been on them for years with no problems.  She has a follow up appointment on 06/08/15 and will discuss this with Evelina Dun, FNP then.

## 2015-06-03 NOTE — Telephone Encounter (Signed)
Patient had stopped metoprolol and heart has been fluttering some. Patient states that she is really tired and wants to know if you think it would be ok if she could take restore HBP RX support. The pharmacist recommend that she try this. And also that maybe we should try stopping the amlodipine and restart the metoprolol.

## 2015-06-03 NOTE — Telephone Encounter (Signed)
Pt can restart metoprolol and stop amlodipine. Pt needs appt to be seen in next week or so

## 2015-06-08 ENCOUNTER — Ambulatory Visit: Payer: Medicare Other | Admitting: Family

## 2015-06-18 ENCOUNTER — Other Ambulatory Visit: Payer: Self-pay | Admitting: Family Medicine

## 2015-06-20 ENCOUNTER — Other Ambulatory Visit: Payer: Self-pay | Admitting: Family

## 2015-06-20 DIAGNOSIS — R42 Dizziness and giddiness: Secondary | ICD-10-CM

## 2015-06-20 MED ORDER — MECLIZINE HCL 25 MG PO TABS: 25.0000 mg | ORAL_TABLET | Freq: Three times a day (TID) | ORAL | Status: DC | PRN

## 2015-06-20 NOTE — Telephone Encounter (Signed)
Pt needs to get meclizine for dizziness refilled. She has not needed it, but seems to be having a small bout of vertigo this weekend.

## 2015-06-24 ENCOUNTER — Ambulatory Visit: Payer: Medicare Other | Admitting: Family

## 2015-07-16 ENCOUNTER — Other Ambulatory Visit: Payer: Self-pay | Admitting: Family

## 2015-07-18 ENCOUNTER — Other Ambulatory Visit: Payer: Self-pay | Admitting: Family

## 2015-07-19 ENCOUNTER — Telehealth: Payer: Self-pay | Admitting: Family

## 2015-07-20 ENCOUNTER — Telehealth: Payer: Self-pay | Admitting: Family

## 2015-07-20 DIAGNOSIS — E039 Hypothyroidism, unspecified: Secondary | ICD-10-CM

## 2015-07-20 MED ORDER — LEVOTHYROXINE SODIUM 50 MCG PO TABS: 50.0000 ug | ORAL_TABLET | Freq: Every day | ORAL | Status: DC

## 2015-07-20 NOTE — Telephone Encounter (Signed)
Stp and advised rx sent to pharmacy and she isn't due for an appt until the end of January.

## 2015-08-18 ENCOUNTER — Other Ambulatory Visit: Payer: Self-pay | Admitting: *Deleted

## 2015-08-18 DIAGNOSIS — F411 Generalized anxiety disorder: Secondary | ICD-10-CM

## 2015-08-18 MED ORDER — ALPRAZOLAM 0.25 MG PO TABS
0.2500 mg | ORAL_TABLET | Freq: Every evening | ORAL | Status: DC | PRN
Start: 2015-08-18 — End: 2015-09-09

## 2015-08-18 NOTE — Telephone Encounter (Signed)
Last filled 09/28/2014, last seen 04/14/15. Route to pool if approved. Call in at [facility]

## 2015-08-18 NOTE — Telephone Encounter (Signed)
rx called into pharmacy

## 2015-09-02 ENCOUNTER — Encounter: Payer: Self-pay | Admitting: Family

## 2015-09-02 ENCOUNTER — Ambulatory Visit (INDEPENDENT_AMBULATORY_CARE_PROVIDER_SITE_OTHER): Payer: Medicare Other | Admitting: Family

## 2015-09-02 VITALS — BP 146/69 | HR 64 | Temp 97.4°F | Ht 62.0 in | Wt 104.0 lb

## 2015-09-02 DIAGNOSIS — L02512 Cutaneous abscess of left hand: Secondary | ICD-10-CM | POA: Diagnosis not present

## 2015-09-02 DIAGNOSIS — L089 Local infection of the skin and subcutaneous tissue, unspecified: Secondary | ICD-10-CM

## 2015-09-02 MED ORDER — SULFAMETHOXAZOLE-TRIMETHOPRIM 800-160 MG PO TABS: 1.0000 | ORAL_TABLET | Freq: Two times a day (BID) | ORAL | Status: DC

## 2015-09-02 NOTE — Progress Notes (Signed)
   Subjective:    Patient ID: April Schneider, female    DOB: 10-20-1932, 81 y.o.   MRN: [id number]  HPI Pt presents to the office today with an infection in the tip of her left middle finger. Pt states she noticed it last week and states is getting worse. Pt has applied mupirocin 2 % with mild relief. Pt denies any injury, but states she works at [employer] and "could have done something there".    Review of Systems  Constitutional: Negative.   HENT: Negative.   Eyes: Negative.   Respiratory: Negative.  Negative for shortness of breath.   Cardiovascular: Negative.  Negative for palpitations.  Gastrointestinal: Negative.   Endocrine: Negative.   Genitourinary: Negative.   Musculoskeletal: Negative.   Neurological: Negative.  Negative for headaches.  Hematological: Negative.   Psychiatric/Behavioral: Negative.   All other systems reviewed and are negative.      Objective:   Physical Exam  Constitutional: She is oriented to person, place, and time. She appears well-developed and well-nourished. No distress.  HENT:  Head: Normocephalic and atraumatic.  Eyes: Pupils are equal, round, and reactive to light.  Neck: Normal range of motion. Neck supple. No thyromegaly present.  Cardiovascular: Normal rate, regular rhythm, normal heart sounds and intact distal pulses.   No murmur heard. Pulmonary/Chest: Effort normal and breath sounds normal. No respiratory distress. She has no wheezes.  Abdominal: Soft. Bowel sounds are normal. She exhibits no distension. There is no tenderness.  Musculoskeletal: Normal range of motion. She exhibits tenderness. She exhibits no edema.  Swelling of left middle finger with the tip erythemas and dried purulent drainage present under the tip of nail  Neurological: She is alert and oriented to person, place, and time. She has normal reflexes. No cranial nerve deficit.  Skin: Skin is warm and dry.  Psychiatric: She has a normal mood and affect. Her  behavior is normal. Judgment and thought content normal.  Vitals reviewed.   BP 146/69 mmHg  Pulse 64  Temp(Src) 97.4 F (36.3 C) (Oral)  Ht 5\' 2"  (1.575 m)  Wt 104 lb (47.174 kg)  BMI 19.02 kg/m2       Assessment & Plan:  1. Infection of finger - sulfamethoxazole-trimethoprim (BACTRIM DS) 800-160 MG tablet; Take 1 tablet by mouth 2 (two) times daily.  Dispense: 14 tablet; Refill: 0  2. Felon, left  -Soak in warm water or  -Keep elevated  -Do not pick or squeeze -RTO in 2 weeks  Evelina Dun, FNP

## 2015-09-02 NOTE — Patient Instructions (Signed)
Fingertip Infection When an infection is around the nail, it is called a paronychia. When it appears over the tip of the finger, it is called a felon. These infections are due to minor injuries or cracks in the skin. If they are not treated properly, they can lead to bone infection and permanent damage to the fingernail. Incision and drainage is necessary if a pus pocket (an abscess) has formed. Antibiotics and pain medicine may also be needed. Keep your hand elevated for the next 2-3 days to reduce swelling and pain. If a pack was placed in the abscess, it should be removed in 1-2 days by your caregiver. Soak the finger in warm water for 20 minutes 4 times daily to help promote drainage. Keep the hands as dry as possible. Wear protective gloves with cotton liners. See your caregiver for follow-up care as recommended.  HOME CARE INSTRUCTIONS   Keep wound clean, dry and dressed as suggested by your caregiver.  Soak in warm salt water for fifteen minutes, four times per day for bacterial infections.  Your caregiver will prescribe an antibiotic if a bacterial infection is suspected. Take antibiotics as directed and finish the prescription, even if the problem appears to be improving before the medicine is gone.  Only take over-the-counter or prescription medicines for pain, discomfort, or fever as directed by your caregiver. SEEK IMMEDIATE MEDICAL CARE IF:  There is redness, swelling, or increasing pain in the wound.  Pus or any other unusual drainage is coming from the wound.  An unexplained oral temperature above 102 F (38.9 C) develops.  You notice a foul smell coming from the wound or dressing. MAKE SURE YOU:   Understand these instructions.  Monitor your condition.  Contact your caregiver if you are getting worse or not improving.   This information is not intended to replace advice given to you by your health care provider. Make sure you discuss any questions you have with your  health care provider.   Document Released: 10/11/2004 Document Revised: 11/26/2011 Document Reviewed: 02/21/2015 Elsevier Interactive Patient Education 2016 Elsevier Inc.  

## 2015-09-05 ENCOUNTER — Telehealth: Payer: Self-pay | Admitting: Family

## 2015-09-05 NOTE — Telephone Encounter (Signed)
Telemetry to cut the pill in half or in fourths and then she can take each segment 1 and a time. It is one of the medications that cover skin infections last and I would recommend to try and take it as best she can and that way.

## 2015-09-05 NOTE — Telephone Encounter (Signed)
Stp and advised of md feedback, pt voiced understanding.

## 2015-09-05 NOTE — Telephone Encounter (Signed)
Pt was given Bactrim is there something else she can get because it is too big.

## 2015-09-08 ENCOUNTER — Telehealth: Payer: Self-pay | Admitting: Family

## 2015-09-08 NOTE — Telephone Encounter (Signed)
Pt aware.

## 2015-09-08 NOTE — Telephone Encounter (Signed)
Note ready for pick up

## 2015-09-08 NOTE — Telephone Encounter (Signed)
Please review and advise.

## 2015-09-09 ENCOUNTER — Other Ambulatory Visit: Payer: Self-pay | Admitting: Family

## 2015-09-13 NOTE — Telephone Encounter (Signed)
rx called into the pharmacy  

## 2015-09-13 NOTE — Telephone Encounter (Signed)
Last seen 09/02/15 April Schneider  If approved route to nurse to call into [facility]   [id number]

## 2015-09-16 ENCOUNTER — Encounter: Payer: Self-pay | Admitting: Family

## 2015-09-16 ENCOUNTER — Ambulatory Visit (INDEPENDENT_AMBULATORY_CARE_PROVIDER_SITE_OTHER): Payer: Medicare Other | Admitting: Family

## 2015-09-16 VITALS — BP 134/73 | HR 71 | Temp 98.5°F | Ht 62.0 in | Wt 103.8 lb

## 2015-09-16 DIAGNOSIS — J309 Allergic rhinitis, unspecified: Secondary | ICD-10-CM

## 2015-09-16 DIAGNOSIS — J069 Acute upper respiratory infection, unspecified: Secondary | ICD-10-CM

## 2015-09-16 DIAGNOSIS — L089 Local infection of the skin and subcutaneous tissue, unspecified: Secondary | ICD-10-CM

## 2015-09-16 MED ORDER — MUPIROCIN 2 % EX OINT: 1.0000 "application " | TOPICAL_OINTMENT | Freq: Two times a day (BID) | CUTANEOUS | Status: DC

## 2015-09-16 MED ORDER — MOMETASONE FUROATE 50 MCG/ACT NA SUSP: 2.0000 | Freq: Every day | NASAL | Status: DC

## 2015-09-16 NOTE — Patient Instructions (Signed)
Upper Respiratory Infection, Adult Most upper respiratory infections (URIs) are a viral infection of the air passages leading to the lungs. A URI affects the nose, throat, and upper air passages. The most common type of URI is nasopharyngitis and is typically referred to as "the common cold." URIs run their course and usually go away on their own. Most of the time, a URI does not require medical attention, but sometimes a bacterial infection in the upper airways can follow a viral infection. This is called a secondary infection. Sinus and middle ear infections are common types of secondary upper respiratory infections. Bacterial pneumonia can also complicate a URI. A URI can worsen asthma and chronic obstructive pulmonary disease (COPD). Sometimes, these complications can require emergency medical care and may be life threatening.  CAUSES Almost all URIs are caused by viruses. A virus is a type of germ and can spread from one person to another.  RISKS FACTORS You may be at risk for a URI if:   You smoke.   You have chronic heart or lung disease.  You have a weakened defense (immune) system.   You are very young or very old.   You have nasal allergies or asthma.  You work in crowded or poorly ventilated areas.  You work in health care facilities or schools. SIGNS AND SYMPTOMS  Symptoms typically develop 2-3 days after you come in contact with a cold virus. Most viral URIs last 7-10 days. However, viral URIs from the influenza virus (flu virus) can last 14-18 days and are typically more severe. Symptoms may include:   Runny or stuffy (congested) nose.   Sneezing.   Cough.   Sore throat.   Headache.   Fatigue.   Fever.   Loss of appetite.   Pain in your forehead, behind your eyes, and over your cheekbones (sinus pain).  Muscle aches.  DIAGNOSIS  Your health care provider may diagnose a URI by:  Physical exam.  Tests to check that your symptoms are not due to  another condition such as:  Strep throat.  Sinusitis.  Pneumonia.  Asthma. TREATMENT  A URI goes away on its own with time. It cannot be cured with medicines, but medicines may be prescribed or recommended to relieve symptoms. Medicines may help:  Reduce your fever.  Reduce your cough.  Relieve nasal congestion. HOME CARE INSTRUCTIONS   Take medicines only as directed by your health care provider.   Gargle warm saltwater or take cough drops to comfort your throat as directed by your health care provider.  Use a warm mist humidifier or inhale steam from a shower to increase air moisture. This may make it easier to breathe.  Drink enough fluid to keep your urine clear or pale yellow.   Eat soups and other clear broths and maintain good nutrition.   Rest as needed.   Return to work when your temperature has returned to normal or as your health care provider advises. You may need to stay home longer to avoid infecting others. You can also use a face mask and careful hand washing to prevent spread of the virus.  Increase the usage of your inhaler if you have asthma.   Do not use any tobacco products, including cigarettes, chewing tobacco, or electronic cigarettes. If you need help quitting, ask your health care provider. PREVENTION  The best way to protect yourself from getting a cold is to practice good hygiene.   Avoid oral or hand contact with people with cold   symptoms.   Wash your hands often if contact occurs.  There is no clear evidence that vitamin C, vitamin E, echinacea, or exercise reduces the chance of developing a cold. However, it is always recommended to get plenty of rest, exercise, and practice good nutrition.  SEEK MEDICAL CARE IF:   You are getting worse rather than better.   Your symptoms are not controlled by medicine.   You have chills.  You have worsening shortness of breath.  You have brown or red mucus.  You have yellow or brown nasal  discharge.  You have pain in your face, especially when you bend forward.  You have a fever.  You have swollen neck glands.  You have pain while swallowing.  You have white areas in the back of your throat. SEEK IMMEDIATE MEDICAL CARE IF:   You have severe or persistent:  Headache.  Ear pain.  Sinus pain.  Chest pain.  You have chronic lung disease and any of the following:  Wheezing.  Prolonged cough.  Coughing up blood.  A change in your usual mucus.  You have a stiff neck.  You have changes in your:  Vision.  Hearing.  Thinking.  Mood. MAKE SURE YOU:   Understand these instructions.  Will watch your condition.  Will get help right away if you are not doing well or get worse.   This information is not intended to replace advice given to you by your health care provider. Make sure you discuss any questions you have with your health care provider.   Document Released: 02/27/2001 Document Revised: 01/18/2015 Document Reviewed: 12/09/2013 Elsevier Interactive Patient Education 2016 Elsevier Inc.  - Take meds as prescribed - Use a cool mist humidifier  -Use saline nose sprays frequently -Saline irrigations of the nose can be very helpful if done frequently.  * 4X daily for 1 week*  * Use of a nettie pot can be helpful with this. Follow directions with this* -Force fluids -For any cough or congestion  Use plain Mucinex- regular strength or max strength is fine   * Children- consult with Pharmacist for dosing -For fever or aces or pains- take tylenol or ibuprofen appropriate for age and weight.  * for fevers greater than 101 orally you may alternate ibuprofen and tylenol every  3 hours. -Throat lozenges if help   Cathaleen Korol, FNP   

## 2015-09-16 NOTE — Progress Notes (Signed)
Subjective:    Patient ID: April Schneider, female    DOB: 1933-06-03, 81 y.o.   MRN: [id number]   HPI Pt presents to the office today to recheck left middle finger infection. PT seen at the office on 09/02/15 and was given a rx of Bactrim BID for 7 days. Pt states she has a "small soreness" still.  Pt reports her finger is improved, but states now she believes she has "a cold". PT states for the last few days she has been coughing, sneezing, rhinorrhea, and "feeling wore down'.    Review of Systems  Constitutional: Negative.   HENT: Positive for rhinorrhea and sneezing. Negative for ear discharge, ear pain, facial swelling, mouth sores and sore throat.   Eyes: Negative.   Respiratory: Positive for cough. Negative for shortness of breath and wheezing.   Cardiovascular: Negative.  Negative for palpitations.  Gastrointestinal: Negative.   Endocrine: Negative.   Genitourinary: Negative.   Musculoskeletal: Negative.   Neurological: Negative.  Negative for headaches.  Hematological: Negative.   Psychiatric/Behavioral: Negative.   All other systems reviewed and are negative.      Objective:   Physical Exam  Constitutional: She is oriented to person, place, and time. She appears well-developed and well-nourished. No distress.  HENT:  Head: Normocephalic and atraumatic.  Right Ear: External ear normal.  Left Ear: External ear normal.  Nasal passage erythemas with mild swelling  Oropharynx erythemas   Eyes: Pupils are equal, round, and reactive to light.  Neck: Normal range of motion. Neck supple. No thyromegaly present.  Cardiovascular: Normal rate, regular rhythm, normal heart sounds and intact distal pulses.   No murmur heard. Pulmonary/Chest: Effort normal and breath sounds normal. No respiratory distress. She has no wheezes.  Abdominal: Soft. Bowel sounds are normal. She exhibits no distension. There is no tenderness.  Musculoskeletal: Normal range of motion. She exhibits  no edema or tenderness.  Neurological: She is alert and oriented to person, place, and time. She has normal reflexes. No cranial nerve deficit.  Skin: Skin is warm and dry.  Psychiatric: She has a normal mood and affect. Her behavior is normal. Judgment and thought content normal.  Vitals reviewed.   BP 134/73 mmHg  Pulse 71  Temp(Src) 98.5 F (36.9 C) (Oral)  Ht 5\' 2"  (1.575 m)  Wt 103 lb 12.8 oz (47.083 kg)  BMI 18.98 kg/m2       Assessment & Plan:  1. Finger infection -Keep clean and dry -Apply bactroban BID - mupirocin ointment (BACTROBAN) 2 %; Place 1 application into the nose 2 (two) times daily.  Dispense: 22 g; Refill: 0  2. Allergic rhinitis, unspecified allergic rhinitis type -Avoid allergens when possible - mometasone (NASONEX) 50 MCG/ACT nasal spray; Place 2 sprays into the nose daily.  Dispense: 17 g; Refill: 12  3. Acute upper respiratory infection -- Take meds as prescribed - Use a cool mist humidifier  -Use saline nose sprays frequently -Saline irrigations of the nose can be very helpful if done frequently.  * 4X daily for 1 week*  * Use of a nettie pot can be helpful with this. Follow directions with this* -Force fluids -For any cough or congestion  Use plain Mucinex- regular strength or max strength is fine   * Children- consult with Pharmacist for dosing -For fever or aces or pains- take tylenol or ibuprofen appropriate for age and weight.  * for fevers greater than 101 orally you may alternate ibuprofen and tylenol every  3  hours. -Throat lozenges if help - mometasone (NASONEX) 50 MCG/ACT nasal spray; Place 2 sprays into the nose daily.  Dispense: 17 g; Refill: [place], FNP

## 2015-10-10 ENCOUNTER — Other Ambulatory Visit: Payer: Self-pay | Admitting: Family

## 2015-10-11 NOTE — Telephone Encounter (Signed)
Last seen 08/3015  April Schneider  Last lipid 04/14/15

## 2015-10-12 ENCOUNTER — Other Ambulatory Visit (INDEPENDENT_AMBULATORY_CARE_PROVIDER_SITE_OTHER): Payer: Medicare Other

## 2015-10-12 DIAGNOSIS — I10 Essential (primary) hypertension: Secondary | ICD-10-CM

## 2015-10-12 DIAGNOSIS — E039 Hypothyroidism, unspecified: Secondary | ICD-10-CM

## 2015-10-12 DIAGNOSIS — E785 Hyperlipidemia, unspecified: Secondary | ICD-10-CM | POA: Diagnosis not present

## 2015-10-12 NOTE — Progress Notes (Signed)
Lab only 

## 2015-10-13 ENCOUNTER — Other Ambulatory Visit: Payer: Self-pay | Admitting: Family

## 2015-10-13 LAB — THYROID PANEL WITH TSH
FREE THYROXINE INDEX: 2.1 (ref 1.2–4.9)
T3 UPTAKE RATIO: 24 % (ref 24–39)
T4, Total: 8.8 ug/dL (ref 4.5–12.0)
TSH: 6.4 u[IU]/mL — AB (ref 0.450–4.500)

## 2015-10-13 LAB — CMP14+EGFR
A/G RATIO: 2.3 (ref 1.1–2.5)
ALBUMIN: 4.3 g/dL (ref 3.5–4.7)
ALT: 9 IU/L (ref 0–32)
AST: 23 IU/L (ref 0–40)
Alkaline Phosphatase: 76 IU/L (ref 39–117)
BILIRUBIN TOTAL: 0.3 mg/dL (ref 0.0–1.2)
BUN/Creatinine Ratio: 17 (ref 11–26)
BUN: 15 mg/dL (ref 8–27)
CHLORIDE: 101 mmol/L (ref 96–106)
CO2: 24 mmol/L (ref 18–29)
Calcium: 9.8 mg/dL (ref 8.7–10.3)
Creatinine, Ser: 0.89 mg/dL (ref 0.57–1.00)
GFR, EST AFRICAN AMERICAN: 71 mL/min/{1.73_m2} (ref >59)
GFR, EST NON AFRICAN AMERICAN: 61 mL/min/{1.73_m2} (ref >59)
GLUCOSE: 118 mg/dL — AB (ref 65–99)
Globulin, Total: 1.9 g/dL (ref 1.5–4.5)
POTASSIUM: 5.1 mmol/L (ref 3.5–5.2)
SODIUM: 141 mmol/L (ref 134–144)
TOTAL PROTEIN: 6.2 g/dL (ref 6.0–8.5)

## 2015-10-13 LAB — LIPID PANEL
CHOL/HDL RATIO: 2.4 ratio (ref 0.0–4.4)
Cholesterol, Total: 179 mg/dL (ref 100–199)
HDL: 74 mg/dL (ref >39)
LDL Calculated: 90 mg/dL (ref 0–99)
Triglycerides: 77 mg/dL (ref 0–149)
VLDL Cholesterol Cal: 15 mg/dL (ref 5–40)

## 2015-10-13 MED ORDER — LEVOTHYROXINE SODIUM 75 MCG PO TABS: 75.0000 ug | ORAL_TABLET | Freq: Every day | ORAL | Status: DC

## 2015-10-14 ENCOUNTER — Other Ambulatory Visit: Payer: Self-pay | Admitting: Family

## 2015-10-17 ENCOUNTER — Telehealth: Payer: Self-pay | Admitting: Family

## 2015-10-17 NOTE — Telephone Encounter (Signed)
Reviewed results with pt and she has new dose of levothyroxine, pt aware to schedule follow up in 2 months to recheck thyroid. Pt states she will CB to schedule.

## 2015-10-27 ENCOUNTER — Telehealth: Payer: Self-pay | Admitting: Family

## 2015-11-01 NOTE — Telephone Encounter (Signed)
Questions answered about a referral to ENT.

## 2015-11-03 ENCOUNTER — Ambulatory Visit (INDEPENDENT_AMBULATORY_CARE_PROVIDER_SITE_OTHER): Payer: Medicare Other | Admitting: Family

## 2015-11-03 ENCOUNTER — Encounter: Payer: Self-pay | Admitting: Family

## 2015-11-03 VITALS — BP 124/62 | HR 69 | Temp 97.0°F | Ht 62.0 in | Wt 103.2 lb

## 2015-11-03 DIAGNOSIS — H9311 Tinnitus, right ear: Secondary | ICD-10-CM | POA: Diagnosis not present

## 2015-11-03 DIAGNOSIS — H6121 Impacted cerumen, right ear: Secondary | ICD-10-CM

## 2015-11-03 NOTE — Progress Notes (Signed)
   Subjective:    Patient ID: April Schneider, female    DOB: [birth year], 82 y.o.   MRN: [id number]  PT presents to the office with tinnitus in her right ear. PT states this started this week. Pt states she has "hissing" noise that comes and goes.  PT states she has had "wax buildup" before, but states she only want the ENT doctor to clean her ears out. Pt states she "trusts [place], but would rather the ENT doctor to do it".  Dizziness This is a new problem. Pertinent negatives include no headaches.      Review of Systems  Constitutional: Negative.   HENT: Negative.   Eyes: Negative.   Respiratory: Negative.  Negative for shortness of breath.   Cardiovascular: Negative.  Negative for palpitations.  Gastrointestinal: Negative.   Endocrine: Negative.   Genitourinary: Negative.   Musculoskeletal: Negative.   Neurological: Positive for dizziness. Negative for headaches.  Hematological: Negative.   Psychiatric/Behavioral: Negative.   All other systems reviewed and are negative.      Objective:   Physical Exam  Constitutional: She is oriented to person, place, and time. She appears well-developed and well-nourished. No distress.  HENT:  Head: Normocephalic and atraumatic.  Mouth/Throat: Oropharynx is clear and moist.  Cerumen present in bilateral ears,  Right ear greater than left. TM visible  on left- WNL  Eyes: Pupils are equal, round, and reactive to light.  Neck: Normal range of motion. Neck supple. No thyromegaly present.  Cardiovascular: Normal rate, regular rhythm, normal heart sounds and intact distal pulses.   No murmur heard. Pulmonary/Chest: Effort normal and breath sounds normal. No respiratory distress. She has no wheezes.  Abdominal: Soft. Bowel sounds are normal. She exhibits no distension. There is no tenderness.  Musculoskeletal: Normal range of motion. She exhibits no edema or tenderness.  Neurological: She is alert and oriented to person, place, and time. She  has normal reflexes. No cranial nerve deficit.  Skin: Skin is warm and dry.  Psychiatric: She has a normal mood and affect. Her behavior is normal. Judgment and thought content normal.  Vitals reviewed.     BP 124/62 mmHg  Pulse 69  Temp(Src) 97 F (36.1 C) (Oral)  Ht 5\' 2"  (1.575 m)  Wt 103 lb 3.2 oz (46.811 kg)  BMI 18.87 kg/m2     Assessment & Plan:  1. Tinnitus, right - Ambulatory referral to ENT  2. Cerumen impaction, right - Ambulatory referral to ENT  -Discussed using wax drops as needed -Patient does not want use to clean ears. Only wants ENT to clean ears out -ENT referral sent -RTO prn    Evelina Dun, FNP

## 2015-11-03 NOTE — Patient Instructions (Signed)
Tinnitus  Tinnitus refers to hearing a sound when there is no actual source for that sound. This is often described as ringing in the ears. However, people with this condition may hear a variety of noises. A person may hear the sound in one ear or in both ears.   The sounds of tinnitus can be soft, loud, or somewhere in between. Tinnitus can last for a few seconds or can be constant for days. It may go away without treatment and come back at various times. When tinnitus is constant or happens often, it can lead to other problems, such as trouble sleeping and trouble concentrating.  Almost everyone experiences tinnitus at some point. Tinnitus that is long-lasting (chronic) or comes back often is a problem that may require medical attention.   CAUSES   The cause of tinnitus is often not known. In some cases, it can result from other problems or conditions, including:   · Exposure to loud noises from machinery, music, or other sources.  · Hearing loss.  · Ear or sinus infections.  · Earwax buildup.  · A foreign object in the ear.  · Use of certain medicines.  · Use of alcohol and caffeine.  · High blood pressure.  · Heart diseases.  · Anemia.  · Allergies.  · Meniere disease.  · Thyroid problems.  · Tumors.  · An enlarged part of a weakened blood vessel (aneurysm).  SYMPTOMS  The main symptom of tinnitus is hearing a sound when there is no source for that sound. It may sound like:   · Buzzing.  · Roaring.  · Ringing.  · Blowing air, similar to the sound heard when you listen to a seashell.  · Hissing.  · Whistling.  · Sizzling.  · Humming.  · Running water.  · A sustained musical note.  DIAGNOSIS   Tinnitus is diagnosed based on your symptoms. Your health care provider will do a physical exam. A comprehensive hearing exam (audiologic exam) will be done if your tinnitus:   · Affects only one ear (unilateral).  · Causes hearing difficulties.  · Lasts 6 months or longer.  You may also need to see a health care provider  who specializes in hearing disorders (audiologist). You may be asked to complete a questionnaire to determine the severity of your tinnitus. Tests may be done to help determine the cause and to rule out other conditions. These can include:  · Imaging studies of your head and brain, such as:    A CT scan.    An MRI.  · An imaging study of your blood vessels (angiogram).  TREATMENT   Treating an underlying medical condition can sometimes make tinnitus go away. If your tinnitus continues, other treatments may include:  · Medicines, such as certain antidepressants or sleeping aids.  · Sound generators to mask the tinnitus. These include:  ¨ Tabletop sound machines that play relaxing sounds to help you fall asleep.  ¨ Wearable devices that fit in your ear and play sounds or music.  ¨ A small device that uses headphones to deliver a signal embedded in music (acoustic neural stimulation). In time, this may change the pathways of your brain and make you less sensitive to tinnitus. This device is used for very severe cases when no other treatment is working.  · Therapy and counseling to help you manage the stress of living with tinnitus.  · Using hearing aids or cochlear implants, if your tinnitus is related to hearing   loss.  HOME CARE INSTRUCTIONS  · When possible, avoid being in loud places and being exposed to loud sounds.  · Wear hearing protection, such as earplugs, when you are exposed to loud noises.  · Do not take stimulants, such as nicotine, alcohol, or caffeine.  · Practice techniques for reducing stress, such as meditation, yoga, or deep breathing.  · Use a white noise machine, a humidifier, or other devices to mask the sound of tinnitus.  · Sleep with your head slightly raised. This may reduce the impact of tinnitus.  · Try to get plenty of rest each night.  SEEK MEDICAL CARE IF:  · You have tinnitus in just one ear.  · Your tinnitus continues for 3 weeks or longer without stopping.  · Home care measures are not  helping.  · You have tinnitus after a head injury.  · You have tinnitus along with any of the following:    Dizziness.    Loss of balance.    Nausea and vomiting.     This information is not intended to replace advice given to you by your health care provider. Make sure you discuss any questions you have with your health care provider.     Document Released: 09/03/2005 Document Revised: 09/24/2014 Document Reviewed: 02/03/2014  Elsevier Interactive Patient Education ©2016 [employer]

## 2015-11-04 DIAGNOSIS — N390 Urinary tract infection, site not specified: Secondary | ICD-10-CM | POA: Diagnosis not present

## 2015-11-04 DIAGNOSIS — R42 Dizziness and giddiness: Secondary | ICD-10-CM | POA: Diagnosis not present

## 2015-11-04 DIAGNOSIS — I679 Cerebrovascular disease, unspecified: Secondary | ICD-10-CM | POA: Diagnosis not present

## 2015-11-05 ENCOUNTER — Telehealth: Payer: Self-pay | Admitting: Family Medicine

## 2015-11-05 ENCOUNTER — Observation Stay (HOSPITAL_COMMUNITY): Payer: Medicare Other

## 2015-11-05 ENCOUNTER — Encounter (HOSPITAL_COMMUNITY): Payer: Self-pay | Admitting: Internal Medicine

## 2015-11-05 ENCOUNTER — Observation Stay (HOSPITAL_COMMUNITY)
Admission: AD | Admit: 2015-11-05 | Discharge: 2015-11-06 | Disposition: A | Discharge status: Home / Self Care | Payer: Medicare Other | Source: Other Acute Inpatient Hospital | Attending: Internal Medicine | Admitting: Internal Medicine

## 2015-11-05 ENCOUNTER — Observation Stay (HOSPITAL_BASED_OUTPATIENT_CLINIC_OR_DEPARTMENT_OTHER): Payer: Medicare Other

## 2015-11-05 DIAGNOSIS — E785 Hyperlipidemia, unspecified: Secondary | ICD-10-CM | POA: Insufficient documentation

## 2015-11-05 DIAGNOSIS — I639 Cerebral infarction, unspecified: Secondary | ICD-10-CM | POA: Diagnosis not present

## 2015-11-05 DIAGNOSIS — I10 Essential (primary) hypertension: Secondary | ICD-10-CM | POA: Diagnosis not present

## 2015-11-05 DIAGNOSIS — E039 Hypothyroidism, unspecified: Secondary | ICD-10-CM | POA: Insufficient documentation

## 2015-11-05 DIAGNOSIS — R2990 Unspecified symptoms and signs involving the nervous system: Secondary | ICD-10-CM

## 2015-11-05 DIAGNOSIS — R42 Dizziness and giddiness: Secondary | ICD-10-CM | POA: Insufficient documentation

## 2015-11-05 DIAGNOSIS — F419 Anxiety disorder, unspecified: Secondary | ICD-10-CM | POA: Diagnosis present

## 2015-11-05 DIAGNOSIS — R55 Syncope and collapse: Secondary | ICD-10-CM | POA: Diagnosis not present

## 2015-11-05 DIAGNOSIS — R269 Unspecified abnormalities of gait and mobility: Principal | ICD-10-CM | POA: Insufficient documentation

## 2015-11-05 DIAGNOSIS — F411 Generalized anxiety disorder: Secondary | ICD-10-CM | POA: Diagnosis not present

## 2015-11-05 DIAGNOSIS — R918 Other nonspecific abnormal finding of lung field: Secondary | ICD-10-CM | POA: Diagnosis not present

## 2015-11-05 DIAGNOSIS — R279 Unspecified lack of coordination: Secondary | ICD-10-CM | POA: Diagnosis not present

## 2015-11-05 DIAGNOSIS — K219 Gastro-esophageal reflux disease without esophagitis: Secondary | ICD-10-CM | POA: Diagnosis not present

## 2015-11-05 DIAGNOSIS — E079 Disorder of thyroid, unspecified: Secondary | ICD-10-CM | POA: Insufficient documentation

## 2015-11-05 DIAGNOSIS — H9319 Tinnitus, unspecified ear: Secondary | ICD-10-CM | POA: Diagnosis present

## 2015-11-05 DIAGNOSIS — Z9989 Dependence on other enabling machines and devices: Secondary | ICD-10-CM | POA: Diagnosis not present

## 2015-11-05 HISTORY — DX: Dizziness and giddiness: R42

## 2015-11-05 LAB — URINALYSIS, DIPSTICK ONLY
BILIRUBIN URINE: NEGATIVE
Glucose, UA: NEGATIVE mg/dL
Hgb urine dipstick: NEGATIVE
KETONES UR: NEGATIVE mg/dL
NITRITE: NEGATIVE
PROTEIN: NEGATIVE mg/dL
SPECIFIC GRAVITY, URINE: 1.008 (ref 1.005–1.030)
pH: 5.5 (ref 5.0–8.0)

## 2015-11-05 LAB — CBC
HCT: 36.7 % (ref 36.0–46.0)
Hemoglobin: 12 g/dL (ref 12.0–15.0)
MCH: 28.6 pg (ref 26.0–34.0)
MCHC: 32.7 g/dL (ref 30.0–36.0)
MCV: 87.4 fL (ref 78.0–100.0)
PLATELETS: 240 10*3/uL (ref 150–400)
RBC: 4.2 MIL/uL (ref 3.87–5.11)
RDW: 13.5 % (ref 11.5–15.5)
WBC: 9.8 10*3/uL (ref 4.0–10.5)

## 2015-11-05 LAB — COMPREHENSIVE METABOLIC PANEL
ALBUMIN: 3.7 g/dL (ref 3.5–5.0)
ALT: 12 U/L — ABNORMAL LOW (ref 14–54)
AST: 20 U/L (ref 15–41)
Alkaline Phosphatase: 65 U/L (ref 38–126)
Anion gap: 13 (ref 5–15)
BUN: 11 mg/dL (ref 6–20)
CHLORIDE: 103 mmol/L (ref 101–111)
CO2: 25 mmol/L (ref 22–32)
Calcium: 9.3 mg/dL (ref 8.9–10.3)
Creatinine, Ser: 0.83 mg/dL (ref 0.44–1.00)
GFR calc Af Amer: >60 mL/min (ref >60)
Glucose, Bld: 113 mg/dL — ABNORMAL HIGH (ref 65–99)
POTASSIUM: 3.6 mmol/L (ref 3.5–5.1)
Sodium: 141 mmol/L (ref 135–145)
Total Bilirubin: 0.2 mg/dL — ABNORMAL LOW (ref 0.3–1.2)
Total Protein: 5.9 g/dL — ABNORMAL LOW (ref 6.5–8.1)

## 2015-11-05 LAB — TSH: TSH: 0.788 u[IU]/mL (ref 0.350–4.500)

## 2015-11-05 LAB — PROTIME-INR
INR: 1.09 (ref 0.00–1.49)
PROTHROMBIN TIME: 14.3 s (ref 11.6–15.2)

## 2015-11-05 LAB — TROPONIN I: Troponin I: <0.03 ng/mL (ref <0.031)

## 2015-11-05 MED ORDER — SODIUM CHLORIDE 0.9 % IV SOLN
INTRAVENOUS | Status: AC
  Administered 2015-11-05: 14:00:00 via INTRAVENOUS

## 2015-11-05 MED ORDER — ENOXAPARIN SODIUM 40 MG/0.4ML ~~LOC~~ SOLN
40.0000 mg | Freq: Every day | SUBCUTANEOUS | Status: DC
  Administered 2015-11-05 – 2015-11-06 (×2): 40 mg via SUBCUTANEOUS
  Filled 2015-11-05 (×2): qty 0.4

## 2015-11-05 MED ORDER — PRAVASTATIN SODIUM 20 MG PO TABS
10.0000 mg | ORAL_TABLET | Freq: Every day | ORAL | Status: DC
  Administered 2015-11-05: 10 mg via ORAL
  Filled 2015-11-05: qty 1

## 2015-11-05 MED ORDER — LORAZEPAM 2 MG/ML IJ SOLN
0.5000 mg | Freq: Once | INTRAMUSCULAR | Status: AC
  Administered 2015-11-05: 0.5 mg via INTRAVENOUS
  Filled 2015-11-05: qty 1

## 2015-11-05 MED ORDER — ENOXAPARIN SODIUM 30 MG/0.3ML ~~LOC~~ SOLN: 30.0000 mg | Freq: Every day | SUBCUTANEOUS | Status: DC

## 2015-11-05 MED ORDER — MECLIZINE HCL 12.5 MG PO TABS: 25.0000 mg | ORAL_TABLET | Freq: Three times a day (TID) | ORAL | Status: DC | PRN

## 2015-11-05 MED ORDER — STROKE: EARLY STAGES OF RECOVERY BOOK
Freq: Once | Status: DC
  Filled 2015-11-05: qty 1

## 2015-11-05 MED ORDER — ALPRAZOLAM 0.25 MG PO TABS: 0.2500 mg | ORAL_TABLET | Freq: Every evening | ORAL | Status: DC | PRN

## 2015-11-05 MED ORDER — SENNOSIDES-DOCUSATE SODIUM 8.6-50 MG PO TABS: 1.0000 | ORAL_TABLET | Freq: Every evening | ORAL | Status: DC | PRN

## 2015-11-05 MED ORDER — METOPROLOL TARTRATE 12.5 MG HALF TABLET
12.5000 mg | ORAL_TABLET | Freq: Every day | ORAL | Status: DC
Start: 2015-11-05 — End: 2015-11-06
  Administered 2015-11-05 – 2015-11-06 (×2): 12.5 mg via ORAL
  Filled 2015-11-05 (×2): qty 1

## 2015-11-05 MED ORDER — LEVOTHYROXINE SODIUM 75 MCG PO TABS
75.0000 ug | ORAL_TABLET | Freq: Every day | ORAL | Status: DC
  Administered 2015-11-05 – 2015-11-06 (×2): 75 ug via ORAL
  Filled 2015-11-05 (×2): qty 1

## 2015-11-05 NOTE — H&P (Addendum)
[facility] History and Physical  April Schneider [id number] DOB: [birth year] DOA: 11/05/2015  Referring physician: opyd PCP: Evelina Dun, FNP   Chief Complaint: dizziness/unsteady gait  HPI: April Schneider is a delightful 82 y.o. female with past mhx htn, hyperlipidemia, vertigo, presents to room 5C19 from [facility] with the chief complaint of dizziness and ataxic gait. Patient describes stroke like symptoms transferred [facility]up.  Information is obtained from the patient and her daughters who are at the bedside. She reports seeing her PCP once a week for the last several weeks for persistent and recurring wax buildup in her right ear. He states usually they claim it out and she does fine. This past week she noted a "humming" sound in her ear after the procedure. Yesterday evening she developed dizziness and unsteady gait. She describes the dizziness as the room spinning and she was walking needing to hold onto things for fear of falling. She denies headache visual disturbances numbness or tingling of extremities. She denies any difficulty speaking or swallowing. She denies chest pain palpitation shortness of breath. She does endorse some nausea without vomiting. She denies any fever chills abdominal pain dysuria hematuria frequency or urgency. He denies any recent trips or sick contacts.  Upon admission she is afebrile hemodynamically stable and not hypoxic. Neuro exam benign   Review of Systems:  10 point review of systems complete and all systems are negative except as indicated in the history of present illness  Past Medical History  Diagnosis Date  . Thyroid disease   . GERD (gastroesophageal reflux disease)   . Hyperlipidemia   . Hypertension   . Anxiety   . IBS (irritable bowel syndrome)   . Tinnitus   . Vertigo    Past Surgical History  Procedure Laterality Date  . Abdominal hysterectomy    . Tia     Social History:  reports  that she has never smoked. She does not have any smokeless tobacco history on file. She reports that she does not drink alcohol or use illicit drugs. She retired 2 weeks ago from [employer] after 25 years of employment. She lives with her husband. She is independent with ADLs. She does report frequent falls because she "trips" a lot Allergies  Allergen Reactions  . Amoxapine And Related Other (See Comments)    Gi upset  . Asa [Aspirin] Other (See Comments)    GI upset    Family History  Problem Relation Age of Onset  . Kidney disease Mother   . Stroke Father   . Vision loss Father   . Heart disease Father   . Diabetes Son      Prior to Admission medications   Medication Sig Start Date End Date Taking? Authorizing Provider  ALPRAZolam Duanne Moron) 0.25 MG tablet TAKE 1 TABLET BY MOUTH AT BEDTIME AS NEEDED 09/13/15   Sharion Balloon, FNP  amLODipine (NORVASC) 5 MG tablet TAKE 1 TABLET BY MOUTH EVERY DAY 10/10/15   Sharion Balloon, FNP  levothyroxine (SYNTHROID, LEVOTHROID) 75 MCG tablet Take 1 tablet (75 mcg total) by mouth daily. 10/13/15   Sharion Balloon, FNP  lovastatin (MEVACOR) 20 MG tablet TAKE 1 TABLET BY MOUTH DAILY AT 6 PM. 07/18/15   Sharion Balloon, FNP  lovastatin (MEVACOR) 20 MG tablet TAKE 1 TABLET BY MOUTH DAILY AT 6 PM. 10/11/15   Sharion Balloon, FNP  meclizine (ANTIVERT) 25 MG tablet Take 1 tablet (25 mg total) by mouth  3 (three) times daily as needed. Patient not taking: Reported on 11/03/2015 06/20/15   Sharion Balloon, FNP  metoprolol tartrate (LOPRESSOR) 25 MG tablet Take 25 mg by mouth daily. 08/15/15   Historical Provider, MD  metoprolol tartrate (LOPRESSOR) 25 MG tablet TAKE 1/2 TABLET BY MOUTH EVERY DAY 10/17/15   Sharion Balloon, FNP  mometasone (NASONEX) 50 MCG/ACT nasal spray Place 2 sprays into the nose daily. 09/16/15   Sharion Balloon, FNP  mupirocin ointment (BACTROBAN) 2 % Place 1 application into the nose 2 (two) times daily. 09/16/15   Sharion Balloon, FNP   sulfamethoxazole-trimethoprim (BACTRIM DS) 800-160 MG tablet Take 1 tablet by mouth 2 (two) times daily. 09/02/15   Sharion Balloon, FNP   Physical Exam: Filed Vitals:   11/05/15 0600  BP: 159/58  Pulse: 81  Temp: 98.2 F (36.8 C)  TempSrc: Oral  SpO2: 92%    Wt Readings from Last 3 Encounters:  11/03/15 46.811 kg (103 lb 3.2 oz)  09/16/15 47.083 kg (103 lb 12.8 oz)  09/02/15 47.174 kg (104 lb)    General:  Appears calm and comfortable, somewhat thin white pleasant Eyes: PERRL, normal lids, irises & conjunctiva ENT: grossly normal hearing, lips & tongue because membranes of her mouth pink slightly dry Neck: no LAD, masses or thyromegaly Cardiovascular: RRR, no m/r/g. No LE edema. Pedal pulses present and palpable Telemetry: SR, no arrhythmias  Respiratory: CTA bilaterally, no w/r/r. Normal respiratory effort. Abdomen: soft, ntnd, flat positive bowel sounds throughout Skin: no rash or induration seen on limited exam Musculoskeletal: grossly normal tone BUE/BLE, joints without swelling Psychiatric: grossly normal mood and affect, speech fluent and appropriate Neurologic: grossly non-focal. Cranial nerve II through XII grossly intact speech clear facial symmetry           Labs on Admission:  Basic Metabolic Panel: No results for input(s): NA, K, CL, CO2, GLUCOSE, BUN, CREATININE, CALCIUM, MG, PHOS in the last 168 hours. Liver Function Tests: No results for input(s): AST, ALT, ALKPHOS, BILITOT, PROT, ALBUMIN in the last 168 hours. No results for input(s): LIPASE, AMYLASE in the last 168 hours. No results for input(s): AMMONIA in the last 168 hours. CBC: No results for input(s): WBC, NEUTROABS, HGB, HCT, MCV, PLT in the last 168 hours. Cardiac Enzymes: No results for input(s): CKTOTAL, CKMB, CKMBINDEX, TROPONINI in the last 168 hours.  BNP (last 3 results) No results for input(s): BNP in the last 8760 hours.  ProBNP (last 3 results) No results for input(s): PROBNP in  the last 8760 hours.  CBG: No results for input(s): GLUCAP in the last 168 hours.  Radiological Exams on Admission: No results found.  EKG: Independently reviewed. Pending  Assessment/Plan Principal Problem:   Stroke-like symptoms Active Problems:   Hypothyroidism   HTN (hypertension)   GAD (generalized anxiety disorder)   HLD (hyperlipidemia)   GERD (gastroesophageal reflux disease)   Anxiety   Tinnitus   Vertigo  #1. Strokelike symptoms/ataxic gait/dizziness. History of hypertension and hyperlipidemia as well as vertigo and persistent right earwax buildup with weekly cleaning. Currently symptoms resolved. Suspect inner ear/vertigo. MRI not available at [place] to telemetry -M MRI and MRA -Bedside swallow eval -Speech therapy occupational therapy and physical therapy to include vestibular exercises -Carotid Doppler -2-D echo -CT of the head -Chest x-ray -We'll obtain hemoglobin A1c lipid panel troponin -Aspirin refused due to allergy continue her statin   #2. Hypertension. Controlled on admission. Home medications include Norvasc Lopressor. -We'll hold meds for  now -Monitor closely  #3. Hypothyroidism. -Obtain TSH -Continue home meds  #4. History of vertigo with recent right ear tinnitus -Physical therapy for vestibular exercisses -home medications include meclizine.  Plan to resume once MRI results   #5. GERD. Stable at baseline -When necessary PPI     Code Status: full DVT Prophylaxis: Family Communication: daughters at bedside Disposition Plan: home when ready likely 24 hours  Time spent: 51 minutes  [facility]   I have examined the patient, reviewed the chart and modified the above note which I agree with. 82 y/o female with vertigo. She has a history of this but it was quite bad last night causing her to feel like she would fall. No other associated neurological complaints.  Resume PRN Meclizine. CT head negative.   TIA/CVa work up ordered along with PT eval.  UA + for WBC however as she is asymptomatic in regards to fevers, suprapubic pain, dysuria, increased frequency, hematuria or pyuria, will hold off on treatment.   Laquinta Hazell,MD Pager # [phone] [email or web address] 11/05/2015, 8:51 AM

## 2015-11-05 NOTE — Progress Notes (Signed)
Occupational Therapy Evaluation/Discharge Patient Details Name: April Schneider MRN: [id number] DOB: [birth year] Today's Date: 11/05/2015    History of Present Illness 82 y.o. female admitted for dizziness, ataxic gait, falling to R side. MRI (-) for acute event. PMH significant for HTN, HLD, thyroid disease, GERD, IBS, anxiety, vertigo, and tinnitus.    Clinical Impression   Patient has been evaluated by Occupational Therapy with no acute OT needs identified. Pt completed all ADLs and functional transfers with min assist for balance deficits only and supervision assist once using RW for mobility. Recommend PT vestibular consult. All education has been completed and pt has no further questions. OT signing off.    Follow Up Recommendations  No OT follow up;Supervision/Assistance - 24 hour    Equipment Recommendations  Other (comment) (RW-2 wheeled)    Recommendations for Other Services PT consult (vestibular)     Precautions / Restrictions Precautions Precautions: Fall Restrictions Weight Bearing Restrictions: No      Mobility Bed Mobility Overal bed mobility: Needs Assistance Bed Mobility: Supine to Sit     Supine to sit: Supervision     General bed mobility comments: Supervision for safety - pt reported dizziness with all position changes  Transfers Overall transfer level: Needs assistance Equipment used: Rolling walker (2 wheeled);1 person hand held assist Transfers: Sit to/from Stand Sit to Stand: Min assist         General transfer comment: Min assist to stabilize balance - pt taking staggering steps and falling in every direction. Once encouraged to use RW pt had noticably decreased LOB and progressed to min guard assist    Balance Overall balance assessment: Needs assistance Sitting-balance support: No upper extremity supported;Feet supported Sitting balance-Leahy Scale: Good     Standing balance support: No upper extremity supported;During  functional activity Standing balance-Leahy Scale: Poor Standing balance comment: LOB x8 when mobilizing without RW - grabbing onto therapist                            ADL Overall ADL's : Needs assistance/impaired     Grooming: Wash/dry hands;Min guard;Standing           Upper Body Dressing : Supervision/safety;Sitting   Lower Body Dressing: Minimal assistance;Sit to/from stand   Toilet Transfer: Minimal assistance;Cueing for safety;Ambulation;Regular Toilet;Grab bars   Toileting- Clothing Manipulation and Hygiene: Minimal assistance;Sit to/from stand       Functional mobility during ADLs: Minimal assistance;Rolling walker General ADL Comments: Pt with significant balance impairments when not using AD and reports of dizziness with position changes. Once pt used RW for mobility her balance improved significantly. Pt's husband present for OT eval.      Vision Vision Assessment?: Yes Eye Alignment: Within Functional Limits Ocular Range of Motion: Within Functional Limits Alignment/Gaze Preference: Within Defined Limits Tracking/Visual Pursuits: Requires cues, head turns, or add eye shifts to track;Able to track stimulus in all quads without difficulty Saccades: Within functional limits Convergence: Within functional limits Visual Fields: No apparent deficits Additional Comments: No reports of dizziness or blurriness during testing   Perception     Praxis      Pertinent Vitals/Pain Pain Assessment: No/denies pain     Hand Dominance Right   Extremity/Trunk Assessment Upper Extremity Assessment Upper Extremity Assessment: Overall WFL for tasks assessed   Lower Extremity Assessment Lower Extremity Assessment: Overall WFL for tasks assessed   Cervical / Trunk Assessment Cervical / Trunk Assessment: Kyphotic   Communication Communication Communication: No  difficulties   Cognition Arousal/Alertness: Awake/alert Behavior During Therapy: WFL for tasks  assessed/performed Overall Cognitive Status: Within Functional Limits for tasks assessed                     General Comments       Exercises       Shoulder Instructions      Home Living Family/patient expects to be discharged to:: Private residence Living Arrangements: Spouse/significant other Available Help at Discharge: Family;Available 24 hours/day Type of Home: Apartment ([place]) Home Access: Level entry     Home Layout: One level     Bathroom Shower/Tub: Walk-in Hydrologist: Standard     Home Equipment: Shower seat - built in;Grab bars - tub/shower;Cane - single point          Prior Functioning/Environment Level of Independence: Independent        Comments: Drives, just recently retired from [employer] after 22 years    OT Diagnosis: Disturbance of vision   OT Problem List: Impaired balance (sitting and/or standing);Decreased coordination;Decreased safety awareness;Decreased knowledge of use of DME or AE;Decreased knowledge of precautions   OT Treatment/Interventions:      OT Goals(Current goals can be found in the care plan section) Acute Rehab OT Goals Patient Stated Goal: to go home and celebrate birthday at home OT Goal Formulation: With patient Time For Goal Achievement: 11/19/15 Potential to Achieve Goals: Good  OT Frequency:     Barriers to D/C:            Co-evaluation              End of Session Equipment Utilized During Treatment: Gait belt;Rolling walker Nurse Communication: Mobility status  Activity Tolerance: Patient tolerated treatment well Patient left: in chair;with call bell/phone within reach;with chair alarm set;with family/visitor present   Time: [id number] OT Time Calculation (min): 40 min Charges:  OT General Charges $OT Visit: 1 Procedure OT Evaluation $OT Eval Low Complexity: 1 Procedure OT Treatments $Self Care/Home Management : 23-37 mins G-Codes: OT G-codes **NOT FOR  INPATIENT CLASS** Functional Assessment Tool Used: clinical judgement Functional Limitation: Self care Self Care Current Status [id number]): At least 1 percent but less than 20 percent impaired, limited or restricted Self Care Goal Status [id number]): At least 1 percent but less than 20 percent impaired, limited or restricted Self Care Discharge Status [phone]): At least 1 percent but less than 20 percent impaired, limited or restricted  Redmond Baseman, OTR/L Pager: [phone] 11/05/2015, 3:36 PM

## 2015-11-05 NOTE — Telephone Encounter (Signed)
Accepted in transfer from [place]. Presented with dizziness, found to have ataxic gait, falling to right. At baseline, can walk unassisted, now cannot. Head CT unremarkable.  Labs and CXR unremarkable. Hemodynamically stable. Concern for posterior circulation CVA. They do not have MRI on weekends and request transfer for this reason.

## 2015-11-05 NOTE — Progress Notes (Signed)
Received pt alert and oriented, no c/o pain. Pt placed on Tele. Pt oriented to the unit and her room. Call bell within reach. Will cont to monitor.

## 2015-11-05 NOTE — Progress Notes (Signed)
Preliminary results by tech - Carotid Duplex Completed. No evidence of a significant stenosis in bilateral carotid arteries. Vertebral arteries demonstrate normal antegrade flow. Jayvin Hurrell, BS, RDMS, RVT  

## 2015-11-05 NOTE — Progress Notes (Signed)
Patient has been transported to MRI °

## 2015-11-06 ENCOUNTER — Encounter (HOSPITAL_COMMUNITY): Payer: Self-pay | Admitting: Emergency Medicine

## 2015-11-06 DIAGNOSIS — K219 Gastro-esophageal reflux disease without esophagitis: Secondary | ICD-10-CM | POA: Diagnosis not present

## 2015-11-06 DIAGNOSIS — R42 Dizziness and giddiness: Secondary | ICD-10-CM | POA: Diagnosis not present

## 2015-11-06 DIAGNOSIS — F411 Generalized anxiety disorder: Secondary | ICD-10-CM | POA: Diagnosis not present

## 2015-11-06 DIAGNOSIS — E039 Hypothyroidism, unspecified: Secondary | ICD-10-CM | POA: Diagnosis not present

## 2015-11-06 DIAGNOSIS — R269 Unspecified abnormalities of gait and mobility: Secondary | ICD-10-CM | POA: Diagnosis not present

## 2015-11-06 DIAGNOSIS — R2990 Unspecified symptoms and signs involving the nervous system: Secondary | ICD-10-CM

## 2015-11-06 DIAGNOSIS — I10 Essential (primary) hypertension: Secondary | ICD-10-CM | POA: Diagnosis not present

## 2015-11-06 DIAGNOSIS — E785 Hyperlipidemia, unspecified: Secondary | ICD-10-CM | POA: Diagnosis not present

## 2015-11-06 LAB — LIPID PANEL
Cholesterol: 168 mg/dL (ref 0–200)
HDL: 67 mg/dL (ref >40)
LDL CALC: 86 mg/dL (ref 0–99)
Total CHOL/HDL Ratio: 2.5 RATIO
Triglycerides: 75 mg/dL (ref <150)
VLDL: 15 mg/dL (ref 0–40)

## 2015-11-06 MED ORDER — MECLIZINE HCL 25 MG PO TABS: 25.0000 mg | ORAL_TABLET | Freq: Three times a day (TID) | ORAL | Status: DC | PRN

## 2015-11-06 NOTE — Progress Notes (Signed)
Physical Therapy Progress Note  PT evaluation completed.  Full note to follow. Patient with no vertigo at this time.  Mod I with all mobility and gait.  No further PT needs at this time.  Patient ready for d/c from PT perspective.  Carita Pian Sanjuana Kava, [place] Pager [phone]

## 2015-11-06 NOTE — Progress Notes (Signed)
Reviewed d/c documents with pt and her husband. Staff helped patient to her vehicle with a wheelchair.

## 2015-11-06 NOTE — Discharge Summary (Signed)
Physician Discharge Summary  April Schneider [id number] DOB: [birth year] DOA: 11/05/2015  PCP: Evelina Dun, FNP  Admit date: 11/05/2015 Discharge date: 11/06/2015  Time spent: 20 minutes  Recommendations for Outpatient Follow-up:  1. Follow up with PCP in 2-3 weeks   Discharge Diagnoses:  Principal Problem:   Stroke-like symptoms Active Problems:   Hypothyroidism   HTN (hypertension)   GAD (generalized anxiety disorder)   HLD (hyperlipidemia)   GERD (gastroesophageal reflux disease)   Anxiety   Tinnitus   Vertigo   Discharge Condition: Stable  Diet recommendation: heart healthy  Filed Weights   11/06/15 0800  Weight: 46.72 kg (103 lb)    History of present illness:  Please review dictated H and P from 2/18 for details. Briefly, 82yo with hx of HTN, HLD, prior vertigo, prior issues with cerumen impaction who presented with complaints of dizziness and unsteady gait. The patient was admitted for work up for possible CVA  Hospital Course:  #1. ataxic gait/dizziness. History of hypertension and hyperlipidemia as well as vertigo and persistent right earwax buildup with weekly cleaning. Currently symptoms resolved. Suspect inner ear/vertigo. MRI negative for stroke - On exam, patient noted to have ample amounts of cerumen with near occlusion B. Family reports patient is scheduled to follow up with ENT as outpatient for earwax removal. Advised patient to follow up as scheduled - symptoms likely secondary to vertigo related to earwax impaction  #2. Hypertension. Controlled on admission. Home medications include Norvasc Lopressor. - Remained stable  #3. Hypothyroidism. - Obtained TSH and was within normal limits - Continued home meds  #4. History of vertigo with recent right ear tinnitus -Physical therapy consulted for vestibular exercises. Patient is clear for d/c per PT -home medications include meclizine. Plan to resume on discharge  #5. GERD. Stable at  baseline -When necessary PPI  Discharge Exam: Filed Vitals:   11/06/15 0648 11/06/15 0800 11/06/15 0916 11/06/15 1326  BP: 137/66  128/48 124/48  Pulse: 84  71 64  Temp: 98.1 F (36.7 C)  98.2 F (36.8 C) 98 F (36.7 C)  TempSrc: Oral  Oral Oral  Resp: 20  18 18   Height:  5' (1.524 m)    Weight:  46.72 kg (103 lb)    SpO2: 95%  95% 98%    General: Awake, in nad Cardiovascular: regular, s1, s2 Respiratory: normal resp effort, no wheezing  Discharge Instructions     Medication List    STOP taking these medications        mometasone 50 MCG/ACT nasal spray  Commonly known as:  NASONEX     mupirocin ointment 2 %  Commonly known as:  BACTROBAN      TAKE these medications        acetaminophen 160 MG/5ML suspension  Commonly known as:  TYLENOL  Take 320 mg by mouth every 6 (six) hours as needed for mild pain, moderate pain or headache.     ALPRAZolam 0.25 MG tablet  Commonly known as:  XANAX  TAKE 1 TABLET BY MOUTH AT BEDTIME AS NEEDED     amLODipine 5 MG tablet  Commonly known as:  NORVASC  TAKE 1 TABLET BY MOUTH EVERY DAY     levothyroxine 75 MCG tablet  Commonly known as:  SYNTHROID, LEVOTHROID  Take 1 tablet (75 mcg total) by mouth daily.     lovastatin 20 MG tablet  Commonly known as:  MEVACOR  TAKE 1 TABLET BY MOUTH DAILY AT 6 PM.     lovastatin  20 MG tablet  Commonly known as:  MEVACOR  TAKE 1 TABLET BY MOUTH DAILY AT 6 PM.     meclizine 25 MG tablet  Commonly known as:  ANTIVERT  Take 1 tablet (25 mg total) by mouth 3 (three) times daily as needed for dizziness.     metoprolol tartrate 25 MG tablet  Commonly known as:  LOPRESSOR  TAKE 1/2 TABLET BY MOUTH EVERY DAY       Allergies  Allergen Reactions  . Amoxapine And Related Other (See Comments)    Gi upset  . Asa [Aspirin] Other (See Comments)    GI upset   Follow-up Information    Follow up with Evelina Dun, FNP. Schedule an appointment as soon as possible for a visit in 2 weeks.    Specialty:  Family Medicine   Why:  Hospital follow up   Contact information:   [address] [place] [address] [phone]        The results of significant diagnostics from this hospitalization (including imaging, microbiology, ancillary and laboratory) are listed below for reference.    Significant Diagnostic Studies: Mr Brain Wo Contrast  11/05/2015  CLINICAL DATA:  Stroke like symptoms.  Vertigo. EXAM: MRI HEAD WITHOUT CONTRAST MRA HEAD WITHOUT CONTRAST TECHNIQUE: Multiplanar, multiecho pulse sequences of the brain and surrounding structures were obtained without intravenous contrast. Angiographic images of the head were obtained using MRA technique without contrast. COMPARISON:  CT head 11/05/2015 FINDINGS: MRI HEAD FINDINGS Negative for acute infarct. Moderate chronic microvascular ischemic change in the white matter. Mild atrophy.  Negative for hydrocephalus. Negative for intracranial hemorrhage. Negative for mass or edema. No shift of the midline structures. MRA HEAD FINDINGS Both vertebral arteries patent to the basilar. Right PICA patent. Left PICA not visualized. Two left AICA vessels are patent. Basilar widely patent. Superior cerebellar and posterior cerebral arteries patent bilaterally Internal carotid artery patent bilaterally. Anterior and middle cerebral arteries appear normal Negative for cerebral aneurysm. IMPRESSION: Negative for acute infarct. Moderate chronic microvascular ischemic change Negative MRA head Electronically Signed   By: Franchot Gallo M.D.   On: 11/05/2015 11:46   Dg Chest Port 1 View  11/05/2015  CLINICAL DATA:  Stroke-like symptoms EXAM: PORTABLE CHEST 1 VIEW COMPARISON:  Chest radiograph from earlier today. FINDINGS: Stable cardiomediastinal silhouette with normal heart size. No pneumothorax. No pleural effusion. There is a vague small nodular opacity in the peripheral mid to lower left lung, which could represent a pulmonary nodule. No pulmonary  edema. No additional focal lung opacities. IMPRESSION: Vague small nodular opacity in the peripheral mid to lower left lung, cannot exclude a pulmonary nodule. Recommend further evaluation with noncontrast chest CT. Electronically Signed   By: Ilona Sorrel M.D.   On: 11/05/2015 15:46   Mr Jodene Nam Head/brain Wo Cm  11/05/2015  CLINICAL DATA:  Stroke like symptoms.  Vertigo. EXAM: MRI HEAD WITHOUT CONTRAST MRA HEAD WITHOUT CONTRAST TECHNIQUE: Multiplanar, multiecho pulse sequences of the brain and surrounding structures were obtained without intravenous contrast. Angiographic images of the head were obtained using MRA technique without contrast. COMPARISON:  CT head 11/05/2015 FINDINGS: MRI HEAD FINDINGS Negative for acute infarct. Moderate chronic microvascular ischemic change in the white matter. Mild atrophy.  Negative for hydrocephalus. Negative for intracranial hemorrhage. Negative for mass or edema. No shift of the midline structures. MRA HEAD FINDINGS Both vertebral arteries patent to the basilar. Right PICA patent. Left PICA not visualized. Two left AICA vessels are patent. Basilar widely patent. Superior  cerebellar and posterior cerebral arteries patent bilaterally Internal carotid artery patent bilaterally. Anterior and middle cerebral arteries appear normal Negative for cerebral aneurysm. IMPRESSION: Negative for acute infarct. Moderate chronic microvascular ischemic change Negative MRA head Electronically Signed   By: Franchot Gallo M.D.   On: 11/05/2015 11:46    Microbiology: No results found for this or any previous visit (from the past 240 hour(s)).   Labs: Basic Metabolic Panel:  Recent Labs Lab 11/05/15 0719  NA 141  K 3.6  CL 103  CO2 25  GLUCOSE 113*  BUN 11  CREATININE 0.83  CALCIUM 9.3   Liver Function Tests:  Recent Labs Lab 11/05/15 0719  AST 20  ALT 12*  ALKPHOS 65  BILITOT 0.2*  PROT 5.9*  ALBUMIN 3.7   No results for input(s): LIPASE, AMYLASE in the last 168  hours. No results for input(s): AMMONIA in the last 168 hours. CBC:  Recent Labs Lab 11/05/15 0719  WBC 9.8  HGB 12.0  HCT 36.7  MCV 87.4  PLT 240   Cardiac Enzymes:  Recent Labs Lab 11/05/15 0719 11/05/15 1337  TROPONINI <0.03 <0.03   BNP: BNP (last 3 results) No results for input(s): BNP in the last 8760 hours.  ProBNP (last 3 results) No results for input(s): PROBNP in the last 8760 hours.  CBG: No results for input(s): GLUCAP in the last 168 hours.   Signed:  Arlester Keehan, Orpah Melter  [facility] 11/06/2015, 3:34 PM

## 2015-11-06 NOTE — Evaluation (Signed)
Physical Therapy Evaluation Patient Details Name: April Schneider MRN: [id number] DOB: 11/15/1932 Today's Date: 11/06/2015   History of Present Illness  82 y.o. female admitted for dizziness, ataxic gait, falling to R side. MRI (-) for acute event. PMH significant for HTN, HLD, thyroid disease, GERD, IBS, anxiety, vertigo, and tinnitus.   Clinical Impression  Patient is functioning at supervision level for all mobility and gait.  No loss of balance with high level balance activities.  Patient reports no dizziness/vertigo today.  No further acute PT needs identified - PT will sign off.  Encouraged patient to f/u with MD for OP PT consult (vestibular rehab) if vertigo returns.    Follow Up Recommendations No PT follow up;Supervision for mobility/OOB    Equipment Recommendations  None recommended by PT    Recommendations for Other Services       Precautions / Restrictions Precautions Precautions: Fall Precaution Comments: Patient with Rt heel pain due to "plantar wart or corn" per patient.  Impacts gait. Restrictions Weight Bearing Restrictions: No      Mobility  Bed Mobility Overal bed mobility: Needs Assistance Bed Mobility: Supine to Sit;Sit to Supine     Supine to sit: Supervision Sit to supine: Supervision   General bed mobility comments: Supervision for safety only.  No dizziness reported with transitions.  Transfers Overall transfer level: Needs assistance Equipment used: None Transfers: Sit to/from Stand Sit to Stand: Supervision         General transfer comment: Patient able to move to standing with supervision for safety.  No physical assist required.  No report of dizziness with transfers.  Ambulation/Gait Ambulation/Gait assistance: Supervision Ambulation Distance (Feet): 140 Feet Assistive device: None Gait Pattern/deviations: Step-through pattern;Decreased stride length;Decreased weight shift to right;Antalgic;Trunk flexed Gait velocity:  decreased Gait velocity interpretation: Below normal speed for age/gender General Gait Details: Patient with antalgic gait with pain in Rt heel with weight bearing (due to plantar wart).  Patient with no loss of balance during gait.  No c/o dizziness with gait or balance activities.  Stairs            Wheelchair Mobility    Modified Rankin (Stroke Patients Only)       Balance           Standing balance support: No upper extremity supported Standing balance-Leahy Scale: Good Standing balance comment: No loss of balance with gait today with no assistive device.         Rhomberg - Eyes Opened: 30 Rhomberg - Eyes Closed: 30 (minimal sway) High level balance activites: Direction changes;Turns;Sudden stops;Head turns High Level Balance Comments: No loss of balance during high level balance activities.  No c/o dizziness during these activities.             Pertinent Vitals/Pain Pain Assessment: 0-10 Pain Score: 8  Pain Location: Rt foot/heel with gait Pain Descriptors / Indicators: Sharp;Sore Pain Intervention(s): Limited activity within patient's tolerance;Monitored during session;Repositioned    Home Living Family/patient expects to be discharged to:: Private residence Living Arrangements: Spouse/significant other Available Help at Discharge: Family;Available 24 hours/day Type of Home: Apartment (Duples) Home Access: Level entry     Home Layout: One level Home Equipment: Shower seat - built in;Grab bars - tub/shower;Cane - single point      Prior Function Level of Independence: Independent         Comments: Drives, just recently retired from [employer] after 22 years     Kaufman: Right  Extremity/Trunk Assessment   Upper Extremity Assessment: Overall WFL for tasks assessed           Lower Extremity Assessment: Overall WFL for tasks assessed      Cervical / Trunk Assessment: Kyphotic  Communication    Communication: No difficulties  Cognition Arousal/Alertness: Awake/alert Behavior During Therapy: WFL for tasks assessed/performed Overall Cognitive Status: Within Functional Limits for tasks assessed                      General Comments      Exercises        Assessment/Plan    PT Assessment Patent does not need any further PT services  PT Diagnosis Abnormality of gait;Acute pain   PT Problem List    PT Treatment Interventions     PT Goals (Current goals can be found in the Care Plan section) Acute Rehab PT Goals Patient Stated Goal: To go home PT Goal Formulation: All assessment and education complete, DC therapy    Frequency     Barriers to discharge        Co-evaluation               End of Session Equipment Utilized During Treatment: Gait belt Activity Tolerance: Patient tolerated treatment well Patient left: in bed;with call bell/phone within reach;with family/visitor present Nurse Communication: Mobility status (No c/o dizziness. Ready for d/c from PT perspective.)    Functional Assessment Tool Used: Clinical judgement Functional Limitation: Mobility: Walking and moving around Mobility: Walking and Moving Around Current Status [phone]): At least 1 percent but less than 20 percent impaired, limited or restricted Mobility: Walking and Moving Around Goal Status [phone]): At least 1 percent but less than 20 percent impaired, limited or restricted Mobility: Walking and Moving Around Discharge Status [phone]): At least 1 percent but less than 20 percent impaired, limited or restricted    Time: [id number] PT Time Calculation (min) (ACUTE ONLY): 22 min   Charges:   PT Evaluation $PT Eval Moderate Complexity: 1 Procedure     PT G Codes:   PT G-Codes **NOT FOR INPATIENT CLASS** Functional Assessment Tool Used: Clinical judgement Functional Limitation: Mobility: Walking and moving around Mobility: Walking and Moving Around Current Status [id number]): At  least 1 percent but less than 20 percent impaired, limited or restricted Mobility: Walking and Moving Around Goal Status [phone]): At least 1 percent but less than 20 percent impaired, limited or restricted Mobility: Walking and Moving Around Discharge Status [phone]): At least 1 percent but less than 20 percent impaired, limited or restricted    Despina Pole 11/06/2015, 4:55 PM Carita Pian. Sanjuana Kava, [place] Pager [phone]

## 2015-11-07 ENCOUNTER — Telehealth: Payer: Self-pay | Admitting: Family

## 2015-11-07 LAB — HEMOGLOBIN A1C
HEMOGLOBIN A1C: 6.3 % — AB (ref 4.8–5.6)
MEAN PLASMA GLUCOSE: 134 mg/dL

## 2015-11-07 NOTE — Telephone Encounter (Signed)
Stp and she wants a referral to the ENT in [place] and offered to do a referral but pt states she's not ready yet and she will call us when she is ready.

## 2015-11-14 DIAGNOSIS — H6123 Impacted cerumen, bilateral: Secondary | ICD-10-CM | POA: Diagnosis not present

## 2015-11-22 ENCOUNTER — Telehealth: Payer: Self-pay | Admitting: Family

## 2015-11-22 NOTE — Telephone Encounter (Signed)
appt scheduled and patient aware

## 2015-11-24 ENCOUNTER — Encounter: Payer: Self-pay | Admitting: Family

## 2015-11-24 ENCOUNTER — Ambulatory Visit (INDEPENDENT_AMBULATORY_CARE_PROVIDER_SITE_OTHER): Payer: Medicare Other | Admitting: Family

## 2015-11-24 ENCOUNTER — Other Ambulatory Visit: Payer: Self-pay | Admitting: Family

## 2015-11-24 VITALS — BP 139/62 | HR 67 | Temp 97.2°F | Ht 60.0 in | Wt 102.4 lb

## 2015-11-24 DIAGNOSIS — F411 Generalized anxiety disorder: Secondary | ICD-10-CM | POA: Diagnosis not present

## 2015-11-24 DIAGNOSIS — Z09 Encounter for follow-up examination after completed treatment for conditions other than malignant neoplasm: Secondary | ICD-10-CM | POA: Diagnosis not present

## 2015-11-24 DIAGNOSIS — R300 Dysuria: Secondary | ICD-10-CM

## 2015-11-24 DIAGNOSIS — H9311 Tinnitus, right ear: Secondary | ICD-10-CM

## 2015-11-24 DIAGNOSIS — R42 Dizziness and giddiness: Secondary | ICD-10-CM | POA: Diagnosis not present

## 2015-11-24 LAB — URINALYSIS, COMPLETE
BILIRUBIN UA: NEGATIVE
Glucose, UA: NEGATIVE
KETONES UA: NEGATIVE
NITRITE UA: NEGATIVE
PH UA: 5 (ref 5.0–7.5)
Protein, UA: NEGATIVE
SPEC GRAV UA: 1.01 (ref 1.005–1.030)
Urobilinogen, Ur: 0.2 mg/dL (ref 0.2–1.0)

## 2015-11-24 LAB — MICROSCOPIC EXAMINATION: RBC MICROSCOPIC, UA: NONE SEEN /HPF (ref 0–?)

## 2015-11-24 MED ORDER — ESCITALOPRAM OXALATE 10 MG PO TABS: 10.0000 mg | ORAL_TABLET | Freq: Every day | ORAL | Status: DC

## 2015-11-24 MED ORDER — SULFAMETHOXAZOLE-TRIMETHOPRIM 800-160 MG PO TABS: 1.0000 | ORAL_TABLET | Freq: Two times a day (BID) | ORAL | Status: DC

## 2015-11-24 NOTE — Progress Notes (Signed)
Subjective:    Patient ID: April Schneider, female    DOB: [birth year], 82 y.o.   MRN: [id number]  PT presents to the office today today for hospital follow up for vertigo or stroke like symptoms. Pt states she had a MRA was negative. PT has since went to the ENT last Friday and had her "ears cleaned out". PT states she continues to have her dizziness, but has slightly improved.    [facility] notes reviewed.  Anxiety Presents for initial visit. Onset was 1 to 5 years ago. The problem has been waxing and waning. Symptoms include excessive worry, insomnia, nervous/anxious behavior, palpitations and panic. Patient reports no depressed mood, irritability, shortness of breath or suicidal ideas. Symptoms occur most days. The severity of symptoms is moderate.   Her past medical history is significant for anxiety/panic attacks.  Dysuria  This is a new problem. The current episode started in the past 7 days. The problem occurs intermittently. The problem has been waxing and waning. The pain is at a severity of 3/10. The pain is mild. Associated symptoms include frequency. Pertinent negatives include no discharge, flank pain, hematuria, urgency or vomiting. She has tried increased fluids for the symptoms. The treatment provided mild relief.      Review of Systems  Constitutional: Negative.  Negative for irritability.  HENT: Negative.   Eyes: Negative.   Respiratory: Negative.  Negative for shortness of breath.   Cardiovascular: Positive for palpitations.  Gastrointestinal: Negative.  Negative for vomiting.  Endocrine: Negative.   Genitourinary: Positive for dysuria and frequency. Negative for urgency, hematuria and flank pain.  Musculoskeletal: Negative.   Neurological: Negative.  Negative for headaches.  Hematological: Negative.   Psychiatric/Behavioral: Negative for suicidal ideas. The patient is nervous/anxious and has insomnia.   All other systems reviewed and are negative.        Objective:   Physical Exam  Constitutional: She is oriented to person, place, and time. She appears well-developed and well-nourished. No distress.  HENT:  Head: Normocephalic and atraumatic.  Right Ear: External ear normal.  Mouth/Throat: Oropharynx is clear and moist.  Eyes: Pupils are equal, round, and reactive to light.  Neck: Normal range of motion. Neck supple. No thyromegaly present.  Cardiovascular: Normal rate, regular rhythm, normal heart sounds and intact distal pulses.   No murmur heard. Pulmonary/Chest: Effort normal and breath sounds normal. No respiratory distress. She has no wheezes.  Abdominal: Soft. Bowel sounds are normal. She exhibits no distension. There is no tenderness.  Musculoskeletal: Normal range of motion. She exhibits no edema or tenderness.  Neurological: She is alert and oriented to person, place, and time. She has normal reflexes. No cranial nerve deficit.  Skin: Skin is warm and dry.  Psychiatric: She has a normal mood and affect. Her behavior is normal. Judgment and thought content normal.  Vitals reviewed.   BP 139/62 mmHg  Pulse 67  Temp(Src) 97.2 F (36.2 C) (Oral)  Ht 5' (1.524 m)  Wt 102 lb 6.4 oz (46.448 kg)  BMI 20.00 kg/m2       Assessment & Plan:  1. Tinnitus, right -Keep follow up with ENT - Next appt tomorrow  2. Vertigo -Continue Antivert as needed -Falls precaution discussed -Keep ENT appt  3. Hospital discharge follow-up  4. GAD (generalized anxiety disorder) -Stress management discussed -Pt started on Lexapro 10 mg today -RTO in 4 weeks - escitalopram (LEXAPRO) 10 MG tablet; Take 1 tablet (10 mg total) by mouth daily.  Dispense: 90 tablet;  Refill: 3  5. Dysuria - Urinalysis, Complete   Continue all meds Labs pending Health Maintenance reviewed Diet and exercise encouraged RTO 4 weeks to recheck GAD  Evelina Dun, FNP

## 2015-11-24 NOTE — Patient Instructions (Addendum)
Tinnitus  Tinnitus refers to hearing a sound when there is no actual source for that sound. This is often described as ringing in the ears. However, people with this condition may hear a variety of noises. A person may hear the sound in one ear or in both ears.   The sounds of tinnitus can be soft, loud, or somewhere in between. Tinnitus can last for a few seconds or can be constant for days. It may go away without treatment and come back at various times. When tinnitus is constant or happens often, it can lead to other problems, such as trouble sleeping and trouble concentrating.  Almost everyone experiences tinnitus at some point. Tinnitus that is long-lasting (chronic) or comes back often is a problem that may require medical attention.   CAUSES   The cause of tinnitus is often not known. In some cases, it can result from other problems or conditions, including:   · Exposure to loud noises from machinery, music, or other sources.  · Hearing loss.  · Ear or sinus infections.  · Earwax buildup.  · A foreign object in the ear.  · Use of certain medicines.  · Use of alcohol and caffeine.  · High blood pressure.  · Heart diseases.  · Anemia.  · Allergies.  · Meniere disease.  · Thyroid problems.  · Tumors.  · An enlarged part of a weakened blood vessel (aneurysm).  SYMPTOMS  The main symptom of tinnitus is hearing a sound when there is no source for that sound. It may sound like:   · Buzzing.  · Roaring.  · Ringing.  · Blowing air, similar to the sound heard when you listen to a seashell.  · Hissing.  · Whistling.  · Sizzling.  · Humming.  · Running water.  · A sustained musical note.  DIAGNOSIS   Tinnitus is diagnosed based on your symptoms. Your health care provider will do a physical exam. A comprehensive hearing exam (audiologic exam) will be done if your tinnitus:   · Affects only one ear (unilateral).  · Causes hearing difficulties.  · Lasts 6 months or longer.  You may also need to see a health care provider  who specializes in hearing disorders (audiologist). You may be asked to complete a questionnaire to determine the severity of your tinnitus. Tests may be done to help determine the cause and to rule out other conditions. These can include:  · Imaging studies of your head and brain, such as:    A CT scan.    An MRI.  · An imaging study of your blood vessels (angiogram).  TREATMENT   Treating an underlying medical condition can sometimes make tinnitus go away. If your tinnitus continues, other treatments may include:  · Medicines, such as certain antidepressants or sleeping aids.  · Sound generators to mask the tinnitus. These include:  ¨ Tabletop sound machines that play relaxing sounds to help you fall asleep.  ¨ Wearable devices that fit in your ear and play sounds or music.  ¨ A small device that uses headphones to deliver a signal embedded in music (acoustic neural stimulation). In time, this may change the pathways of your brain and make you less sensitive to tinnitus. This device is used for very severe cases when no other treatment is working.  · Therapy and counseling to help you manage the stress of living with tinnitus.  · Using hearing aids or cochlear implants, if your tinnitus is related to hearing   or deep breathing.  Use a white noise machine, a humidifier, or other devices to mask the sound of tinnitus.  Sleep with your head slightly raised. This may reduce the impact of tinnitus.  Try to get plenty of rest each night. SEEK MEDICAL CARE IF:  You have tinnitus in just one ear.  Your tinnitus continues for 3 weeks or longer without stopping.  Home care measures are not  helping.  You have tinnitus after a head injury.  You have tinnitus along with any of the following:  Dizziness.  Loss of balance.  Nausea and vomiting.   This information is not intended to replace advice given to you by your health care provider. Make sure you discuss any questions you have with your health care provider.   Document Released: 09/03/2005 Document Revised: 09/24/2014 Document Reviewed: 02/03/2014 Elsevier Interactive Patient Education 2016 [employer] Generalized Anxiety Disorder Generalized anxiety disorder (GAD) is a mental disorder. It interferes with life functions, including relationships, work, and school. GAD is different from normal anxiety, which everyone experiences at some point in their lives in response to specific life events and activities. Normal anxiety actually helps [place] prepare for and get through these life events and activities. Normal anxiety goes away after the event or activity is over.  GAD causes anxiety that is not necessarily related to specific events or activities. It also causes excess anxiety in proportion to specific events or activities. The anxiety associated with GAD is also difficult to control. GAD can vary from mild to severe. People with severe GAD can have intense waves of anxiety with physical symptoms (panic attacks).  SYMPTOMS The anxiety and worry associated with GAD are difficult to control. This anxiety and worry are related to many life events and activities and also occur more days than not for 6 months or longer. People with GAD also have three or more of the following symptoms (one or more in children):  Restlessness.   Fatigue.  Difficulty concentrating.   Irritability.  Muscle tension.  Difficulty sleeping or unsatisfying sleep. DIAGNOSIS GAD is diagnosed through an assessment by your health care provider. Your health care provider will ask you questions aboutyour mood,physical symptoms, and events in your  life. Your health care provider may ask you about your medical history and use of alcohol or drugs, including prescription medicines. Your health care provider may also do a physical exam and blood tests. Certain medical conditions and the use of certain substances can cause symptoms similar to those associated with GAD. Your health care provider may refer you to a mental health specialist for further evaluation. TREATMENT The following therapies are usually used to treat GAD:   Medication. Antidepressant medication usually is prescribed for long-term daily control. Antianxiety medicines may be added in severe cases, especially when panic attacks occur.   Talk therapy (psychotherapy). Certain types of talk therapy can be helpful in treating GAD by providing support, education, and guidance. A form of talk therapy called cognitive behavioral therapy can teach you healthy ways to think about and react to daily life events and activities.  Stress managementtechniques. These include yoga, meditation, and exercise and can be very helpful when they are practiced regularly. A mental health specialist can help determine which treatment is best for you. Some people see improvement with one therapy. However, other people require a combination of therapies.   This information is not intended to replace advice given to you by your health care provider. Make sure you discuss any  questions you have with your health care provider.   Document Released: 12/29/2012 Document Revised: 09/24/2014 Document Reviewed: 12/29/2012 Elsevier Interactive Patient Education [employer].

## 2015-11-25 ENCOUNTER — Telehealth: Payer: Self-pay | Admitting: Family

## 2015-11-25 DIAGNOSIS — F419 Anxiety disorder, unspecified: Secondary | ICD-10-CM | POA: Diagnosis not present

## 2015-11-25 DIAGNOSIS — F23 Brief psychotic disorder: Secondary | ICD-10-CM | POA: Diagnosis not present

## 2015-11-25 NOTE — Telephone Encounter (Signed)
Pt to stop lexapro

## 2015-11-25 NOTE — Telephone Encounter (Signed)
Patient started the lexapro last night around 6pm and at 3am she said she came out of the bed screaming and her whole body felt like she was on fire. She called 911 and they checked her vitals and everything was ok. She said that it is a symptom that she read about from the rx.

## 2015-11-25 NOTE — Telephone Encounter (Signed)
Patient aware.

## 2015-11-28 ENCOUNTER — Telehealth: Payer: Self-pay | Admitting: Family

## 2015-11-28 NOTE — Telephone Encounter (Signed)
RX was sent for 5 days/. PT should be on day 4 now. I can give rx of zofran if she needs it

## 2015-11-28 NOTE — Telephone Encounter (Signed)
Patient states that she will try and take without the zofran and see how she does.

## 2015-12-02 DIAGNOSIS — H9041 Sensorineural hearing loss, unilateral, right ear, with unrestricted hearing on the contralateral side: Secondary | ICD-10-CM | POA: Diagnosis not present

## 2015-12-02 DIAGNOSIS — H9311 Tinnitus, right ear: Secondary | ICD-10-CM | POA: Diagnosis not present

## 2015-12-02 DIAGNOSIS — H9111 Presbycusis, right ear: Secondary | ICD-10-CM | POA: Diagnosis not present

## 2015-12-16 ENCOUNTER — Encounter: Payer: Self-pay | Admitting: Family

## 2015-12-16 ENCOUNTER — Ambulatory Visit (INDEPENDENT_AMBULATORY_CARE_PROVIDER_SITE_OTHER): Payer: Medicare Other | Admitting: Family

## 2015-12-16 VITALS — BP 119/52 | HR 68 | Temp 97.5°F | Ht 60.0 in | Wt 104.8 lb

## 2015-12-16 DIAGNOSIS — F411 Generalized anxiety disorder: Secondary | ICD-10-CM | POA: Diagnosis not present

## 2015-12-16 DIAGNOSIS — N3090 Cystitis, unspecified without hematuria: Secondary | ICD-10-CM | POA: Diagnosis not present

## 2015-12-16 LAB — MICROSCOPIC EXAMINATION
Epithelial Cells (non renal): >10 /hpf — AB (ref 0–10)
RBC, UA: NONE SEEN /hpf (ref 0–?)

## 2015-12-16 LAB — URINALYSIS, COMPLETE
Bilirubin, UA: NEGATIVE
GLUCOSE, UA: NEGATIVE
Ketones, UA: NEGATIVE
Nitrite, UA: NEGATIVE
PH UA: 5 (ref 5.0–7.5)
PROTEIN UA: NEGATIVE
RBC, UA: NEGATIVE
Specific Gravity, UA: <1.005 — ABNORMAL LOW (ref 1.005–1.030)
Urobilinogen, Ur: 0.2 mg/dL (ref 0.2–1.0)

## 2015-12-16 NOTE — Progress Notes (Signed)
   Subjective:    Patient ID: April Schneider, female    DOB: [birth year], 82 y.o.   MRN: [id number]   HPI Pt presents to the office today to recheck GAD and urine. Pt was started on Lexapro 10 mg, but states she was scared to take it. Pt states she did take one dose but had to call EMS because her body felt like she was on fire. PT states she is just trying to cope with her anxiety at this time. Pt states her anxiety is triggered by her tinnitus. Pt states she has seen the ENT several and had both ears cleaned out and that has helped.   Pt was given bactrim for a UTI. Pt denies any dysuria at this time.    Review of Systems  Constitutional: Negative.   HENT: Negative.   Eyes: Negative.   Respiratory: Negative.  Negative for shortness of breath.   Cardiovascular: Negative.  Negative for palpitations.  Gastrointestinal: Negative.   Endocrine: Negative.   Genitourinary: Negative.   Musculoskeletal: Negative.   Neurological: Negative.  Negative for headaches.  Hematological: Negative.   Psychiatric/Behavioral: Negative.   All other systems reviewed and are negative.      Objective:   Physical Exam  Constitutional: She is oriented to person, place, and time. She appears well-developed and well-nourished. No distress.  HENT:  Head: Normocephalic and atraumatic.  Eyes: Pupils are equal, round, and reactive to light.  Neck: Normal range of motion. Neck supple. No thyromegaly present.  Cardiovascular: Normal rate, regular rhythm, normal heart sounds and intact distal pulses.   No murmur heard. Pulmonary/Chest: Effort normal and breath sounds normal. No respiratory distress. She has no wheezes.  Abdominal: Soft. Bowel sounds are normal. She exhibits no distension. There is no tenderness.  Musculoskeletal: Normal range of motion. She exhibits no edema or tenderness.  Neurological: She is alert and oriented to person, place, and time. She has normal reflexes. No cranial nerve deficit.   Skin: Skin is warm and dry.  Psychiatric: She has a normal mood and affect. Her behavior is normal. Judgment and thought content normal.  Vitals reviewed.   BP 119/52 mmHg  Pulse 68  Temp(Src) 97.5 F (36.4 C) (Oral)  Ht 5' (1.524 m)  Wt 104 lb 12.8 oz (47.537 kg)  BMI 20.47 kg/m2       Assessment & Plan:  1. Bladder infection -Resolved - Urinalysis, Complete - Urine culture  2. GAD (generalized anxiety disorder) Stress management discussed  Keep appts with ENT   Evelina Dun, FNP

## 2015-12-16 NOTE — Patient Instructions (Signed)
Generalized Anxiety Disorder Generalized anxiety disorder (GAD) is a mental disorder. It interferes with life functions, including relationships, work, and school. GAD is different from normal anxiety, which everyone experiences at some point in their lives in response to specific life events and activities. Normal anxiety actually helps us prepare for and get through these life events and activities. Normal anxiety goes away after the event or activity is over.  GAD causes anxiety that is not necessarily related to specific events or activities. It also causes excess anxiety in proportion to specific events or activities. The anxiety associated with GAD is also difficult to control. GAD can vary from mild to severe. People with severe GAD can have intense waves of anxiety with physical symptoms (panic attacks).  SYMPTOMS The anxiety and worry associated with GAD are difficult to control. This anxiety and worry are related to many life events and activities and also occur more days than not for 6 months or longer. People with GAD also have three or more of the following symptoms (one or more in children):  Restlessness.   Fatigue.  Difficulty concentrating.   Irritability.  Muscle tension.  Difficulty sleeping or unsatisfying sleep. DIAGNOSIS GAD is diagnosed through an assessment by your health care provider. Your health care provider will ask you questions aboutyour mood,physical symptoms, and events in your life. Your health care provider may ask you about your medical history and use of alcohol or drugs, including prescription medicines. Your health care provider may also do a physical exam and blood tests. Certain medical conditions and the use of certain substances can cause symptoms similar to those associated with GAD. Your health care provider may refer you to a mental health specialist for further evaluation. TREATMENT The following therapies are usually used to treat GAD:    Medication. Antidepressant medication usually is prescribed for long-term daily control. Antianxiety medicines may be added in severe cases, especially when panic attacks occur.   Talk therapy (psychotherapy). Certain types of talk therapy can be helpful in treating GAD by providing support, education, and guidance. A form of talk therapy called cognitive behavioral therapy can teach you healthy ways to think about and react to daily life events and activities.  Stress managementtechniques. These include yoga, meditation, and exercise and can be very helpful when they are practiced regularly. A mental health specialist can help determine which treatment is best for you. Some people see improvement with one therapy. However, other people require a combination of therapies.   This information is not intended to replace advice given to you by your health care provider. Make sure you discuss any questions you have with your health care provider.   Document Released: 12/29/2012 Document Revised: 09/24/2014 Document Reviewed: 12/29/2012 Elsevier Interactive Patient Education 2016 Elsevier Inc.  

## 2015-12-18 LAB — URINE CULTURE

## 2015-12-19 ENCOUNTER — Telehealth: Payer: Self-pay | Admitting: Family

## 2015-12-19 ENCOUNTER — Other Ambulatory Visit: Payer: Self-pay | Admitting: Family

## 2015-12-19 MED ORDER — NITROFURANTOIN MONOHYD MACRO 100 MG PO CAPS: 100.0000 mg | ORAL_CAPSULE | Freq: Two times a day (BID) | ORAL | Status: DC

## 2015-12-19 NOTE — Telephone Encounter (Signed)
Patient wants to know if she can have her thyroid checked. Pharmacist told her that the antibiotics she was taking could affect her levothyroxine and she would like her levels checked. Please advise

## 2015-12-20 ENCOUNTER — Other Ambulatory Visit: Payer: Self-pay | Admitting: *Deleted

## 2015-12-20 ENCOUNTER — Other Ambulatory Visit: Payer: Medicare Other

## 2015-12-20 DIAGNOSIS — R5383 Other fatigue: Secondary | ICD-10-CM | POA: Diagnosis not present

## 2015-12-20 NOTE — Telephone Encounter (Signed)
Labs ordered.

## 2015-12-21 LAB — TSH: TSH: 0.247 u[IU]/mL — AB (ref 0.450–4.500)

## 2015-12-22 ENCOUNTER — Other Ambulatory Visit: Payer: Self-pay | Admitting: Family

## 2015-12-22 MED ORDER — LEVOTHYROXINE SODIUM 50 MCG PO TABS: 50.0000 ug | ORAL_TABLET | Freq: Every day | ORAL | Status: DC

## 2016-01-09 ENCOUNTER — Telehealth: Payer: Self-pay | Admitting: Family

## 2016-01-09 ENCOUNTER — Other Ambulatory Visit: Payer: Self-pay | Admitting: Family

## 2016-01-09 ENCOUNTER — Other Ambulatory Visit: Payer: Self-pay | Admitting: *Deleted

## 2016-01-09 MED ORDER — AMLODIPINE BESYLATE 5 MG PO TABS: 5.0000 mg | ORAL_TABLET | Freq: Every day | ORAL | Status: DC

## 2016-01-09 NOTE — Telephone Encounter (Signed)
Patient needs refill on metoprolol and lovastatin.  Her cholesterol numbers look good. Does she need the lovastatin?

## 2016-01-09 NOTE — Telephone Encounter (Signed)
done

## 2016-01-10 MED ORDER — METOPROLOL TARTRATE 25 MG PO TABS: 12.5000 mg | ORAL_TABLET | Freq: Every day | ORAL | Status: DC

## 2016-01-10 NOTE — Telephone Encounter (Signed)
PT can stop lovastatin. Metoprolol reordered

## 2016-01-10 NOTE — Telephone Encounter (Signed)
Patient aware, refill sent in and she can stop taking the cholesterol medications.

## 2016-02-27 ENCOUNTER — Telehealth: Payer: Self-pay | Admitting: Family

## 2016-03-02 DIAGNOSIS — H25813 Combined forms of age-related cataract, bilateral: Secondary | ICD-10-CM | POA: Diagnosis not present

## 2016-03-02 DIAGNOSIS — H4323 Crystalline deposits in vitreous body, bilateral: Secondary | ICD-10-CM | POA: Diagnosis not present

## 2016-03-02 DIAGNOSIS — H1851 Endothelial corneal dystrophy: Secondary | ICD-10-CM | POA: Diagnosis not present

## 2016-03-02 DIAGNOSIS — H43813 Vitreous degeneration, bilateral: Secondary | ICD-10-CM | POA: Diagnosis not present

## 2016-03-09 ENCOUNTER — Encounter: Payer: Self-pay | Admitting: Family

## 2016-03-09 ENCOUNTER — Ambulatory Visit (INDEPENDENT_AMBULATORY_CARE_PROVIDER_SITE_OTHER): Payer: Medicare Other | Admitting: Family

## 2016-03-09 VITALS — BP 105/59 | HR 61 | Temp 98.0°F | Ht 60.0 in | Wt 105.0 lb

## 2016-03-09 DIAGNOSIS — F411 Generalized anxiety disorder: Secondary | ICD-10-CM | POA: Diagnosis not present

## 2016-03-09 DIAGNOSIS — K219 Gastro-esophageal reflux disease without esophagitis: Secondary | ICD-10-CM

## 2016-03-09 DIAGNOSIS — E039 Hypothyroidism, unspecified: Secondary | ICD-10-CM

## 2016-03-09 DIAGNOSIS — R42 Dizziness and giddiness: Secondary | ICD-10-CM | POA: Diagnosis not present

## 2016-03-09 DIAGNOSIS — I10 Essential (primary) hypertension: Secondary | ICD-10-CM

## 2016-03-09 DIAGNOSIS — E785 Hyperlipidemia, unspecified: Secondary | ICD-10-CM

## 2016-03-09 NOTE — Progress Notes (Signed)
Subjective:    Patient ID: April Schneider, female    DOB: 01-Sep-1933, 82 y.o.   MRN: [id number]  PT presents to the office today for chronic follow up.  Hypertension This is a chronic problem. The current episode started more than 1 year ago. The problem has been resolved since onset. The problem is controlled. Associated symptoms include anxiety. Pertinent negatives include no headaches, palpitations, peripheral edema or shortness of breath. Risk factors for coronary artery disease include dyslipidemia and post-menopausal state. Past treatments include beta blockers. The current treatment provides moderate improvement. Hypertensive end-organ damage includes a thyroid problem. There is no history of kidney disease, CAD/MI, CVA or heart failure.  Thyroid Problem Symptoms include anxiety and hoarse voice. Patient reports no depressed mood, diarrhea, hair loss, palpitations or weight gain. The symptoms have been stable. Past treatments include levothyroxine. The treatment provided moderate relief. Her past medical history is significant for hyperlipidemia. There is no history of heart failure.  Hyperlipidemia This is a chronic problem. The current episode started more than 1 year ago. The problem is controlled. Pertinent negatives include no shortness of breath. Current antihyperlipidemic treatment includes diet change. The current treatment provides moderate improvement of lipids. Risk factors for coronary artery disease include dyslipidemia, hypertension, post-menopausal and family history.  Anxiety Presents for follow-up visit. Symptoms include dizziness, excessive worry and nervous/anxious behavior. Patient reports no depressed mood, irritability, palpitations, shortness of breath or suicidal ideas. Symptoms occur occasionally. The symptoms are aggravated by specific phobias. The quality of sleep is good. Nighttime awakenings: none.   Her past medical history is significant for anxiety/panic  attacks. Past treatments include lifestyle changes. The treatment provided moderate relief. Compliance with prior treatments has been good.  Dizziness This is a chronic problem. The current episode started more than 1 year ago. The problem occurs intermittently. The problem has been waxing and waning. Pertinent negatives include no headaches. The symptoms are aggravated by bending. Treatments tried: antivert. The treatment provided moderate relief.      Review of Systems  Constitutional: Negative.  Negative for weight gain and irritability.  HENT: Positive for hoarse voice.   Eyes: Negative.   Respiratory: Negative.  Negative for shortness of breath.   Cardiovascular: Negative.  Negative for palpitations.  Gastrointestinal: Negative.  Negative for diarrhea.  Endocrine: Negative.   Genitourinary: Negative.   Musculoskeletal: Negative.   Neurological: Positive for dizziness. Negative for headaches.  Hematological: Negative.   Psychiatric/Behavioral: Negative for suicidal ideas. The patient is nervous/anxious.   All other systems reviewed and are negative.      Objective:   Physical Exam  Constitutional: She is oriented to person, place, and time. She appears well-developed and well-nourished. No distress.  HENT:  Head: Normocephalic and atraumatic.  Right Ear: External ear normal.  Left Ear: External ear normal.  Nose: Nose normal.  Mouth/Throat: Oropharynx is clear and moist.  Eyes: Pupils are equal, round, and reactive to light.  Neck: Normal range of motion. Neck supple. No thyromegaly present.  Cardiovascular: Normal rate, regular rhythm, normal heart sounds and intact distal pulses.   No murmur heard. Pulmonary/Chest: Effort normal and breath sounds normal. No respiratory distress. She has no wheezes.  Abdominal: Soft. Bowel sounds are normal. She exhibits no distension. There is no tenderness.  Musculoskeletal: Normal range of motion. She exhibits no edema or tenderness.    Neurological: She is alert and oriented to person, place, and time.  Skin: Skin is warm and dry.  Psychiatric: She has  a normal mood and affect. Her behavior is normal. Judgment and thought content normal.  Vitals reviewed.   BP 105/59 mmHg  Pulse 61  Temp(Src) 98 F (36.7 C) (Oral)  Ht 5' (1.524 m)  Wt 105 lb (47.628 kg)  BMI 20.51 kg/m2       Assessment & Plan:  1. Essential hypertension - CMP14+EGFR  2. Gastroesophageal reflux disease, esophagitis presence not specified  - CMP14+EGFR  3. Hypothyroidism, unspecified hypothyroidism type - CMP14+EGFR - Thyroid Panel With TSH  4. Hyperlipidemia - CMP14+EGFR - Lipid panel  5. GAD (generalized anxiety disorder) - CMP14+EGFR  6. Vertigo - CMP14+EGFR   Continue all meds Labs pending Health Maintenance reviewed- PT refuses Dexa scan and vaccines today Diet and exercise encouraged RTO 6 months  Evelina Dun, FNP

## 2016-03-09 NOTE — Patient Instructions (Signed)
Health Maintenance, Female Adopting a healthy lifestyle and getting preventive care can go a long way to promote health and wellness. Talk with your health care provider about what schedule of regular examinations is right for you. This is a good chance for you to check in with your provider about disease prevention and staying healthy. In between checkups, there are plenty of things you can do on your own. Experts have done a lot of research about which lifestyle changes and preventive measures are most likely to keep you healthy. Ask your health care provider for more information. WEIGHT AND DIET  Eat a healthy diet  Be sure to include plenty of vegetables, fruits, low-fat dairy products, and lean protein.  Do not eat a lot of foods high in solid fats, added sugars, or salt.  Get regular exercise. This is one of the most important things you can do for your health.  Most adults should exercise for at least 150 minutes each week. The exercise should increase your heart rate and make you sweat (moderate-intensity exercise).  Most adults should also do strengthening exercises at least twice a week. This is in addition to the moderate-intensity exercise.  Maintain a healthy weight  Body mass index (BMI) is a measurement that can be used to identify possible weight problems. It estimates body fat based on height and weight. Your health care provider can help determine your BMI and help you achieve or maintain a healthy weight.  For females 20 years of age and older:   A BMI below 18.5 is considered underweight.  A BMI of 18.5 to 24.9 is normal.  A BMI of 25 to 29.9 is considered overweight.  A BMI of 30 and above is considered obese.  Watch levels of cholesterol and blood lipids  You should start having your blood tested for lipids and cholesterol at 80 years of age, then have this test every 5 years.  You may need to have your cholesterol levels checked more often if:  Your lipid  or cholesterol levels are high.  You are older than 80 years of age.  You are at high risk for heart disease.  CANCER SCREENING   Lung Cancer  Lung cancer screening is recommended for adults 55-80 years old who are at high risk for lung cancer because of a history of smoking.  A yearly low-dose CT scan of the lungs is recommended for people who:  Currently smoke.  Have quit within the past 15 years.  Have at least a 30-pack-year history of smoking. A pack year is smoking an average of one pack of cigarettes a day for 1 year.  Yearly screening should continue until it has been 15 years since you quit.  Yearly screening should stop if you develop a health problem that would prevent you from having lung cancer treatment.  Breast Cancer  Practice breast self-awareness. This means understanding how your breasts normally appear and feel.  It also means doing regular breast self-exams. Let your health care provider know about any changes, no matter how small.  If you are in your 20s or 30s, you should have a clinical breast exam (CBE) by a health care provider every 1-3 years as part of a regular health exam.  If you are 40 or older, have a CBE every year. Also consider having a breast X-ray (mammogram) every year.  If you have a family history of breast cancer, talk to your health care provider about genetic screening.  If you   are at high risk for breast cancer, talk to your health care provider about having an MRI and a mammogram every year.  Breast cancer gene (BRCA) assessment is recommended for women who have family members with BRCA-related cancers. BRCA-related cancers include:  Breast.  Ovarian.  Tubal.  Peritoneal cancers.  Results of the assessment will determine the need for genetic counseling and BRCA1 and BRCA2 testing. Cervical Cancer Your health care provider may recommend that you be screened regularly for cancer of the pelvic organs (ovaries, uterus, and  vagina). This screening involves a pelvic examination, including checking for microscopic changes to the surface of your cervix (Pap test). You may be encouraged to have this screening done every 3 years, beginning at age 21.  For women ages 30-65, health care providers may recommend pelvic exams and Pap testing every 3 years, or they may recommend the Pap and pelvic exam, combined with testing for human papilloma virus (HPV), every 5 years. Some types of HPV increase your risk of cervical cancer. Testing for HPV may also be done on women of any age with unclear Pap test results.  Other health care providers may not recommend any screening for nonpregnant women who are considered low risk for pelvic cancer and who do not have symptoms. Ask your health care provider if a screening pelvic exam is right for you.  If you have had past treatment for cervical cancer or a condition that could lead to cancer, you need Pap tests and screening for cancer for at least 20 years after your treatment. If Pap tests have been discontinued, your risk factors (such as having a new sexual partner) need to be reassessed to determine if screening should resume. Some women have medical problems that increase the chance of getting cervical cancer. In these cases, your health care provider may recommend more frequent screening and Pap tests. Colorectal Cancer  This type of cancer can be detected and often prevented.  Routine colorectal cancer screening usually begins at 80 years of age and continues through 80 years of age.  Your health care provider may recommend screening at an earlier age if you have risk factors for colon cancer.  Your health care provider may also recommend using home test kits to check for hidden blood in the stool.  A small camera at the end of a tube can be used to examine your colon directly (sigmoidoscopy or colonoscopy). This is done to check for the earliest forms of colorectal  cancer.  Routine screening usually begins at age 50.  Direct examination of the colon should be repeated every 5-10 years through 80 years of age. However, you may need to be screened more often if early forms of precancerous polyps or small growths are found. Skin Cancer  Check your skin from head to toe regularly.  Tell your health care provider about any new moles or changes in moles, especially if there is a change in a mole's shape or color.  Also tell your health care provider if you have a mole that is larger than the size of a pencil eraser.  Always use sunscreen. Apply sunscreen liberally and repeatedly throughout the day.  Protect yourself by wearing long sleeves, pants, a wide-brimmed hat, and sunglasses whenever you are outside. HEART DISEASE, DIABETES, AND HIGH BLOOD PRESSURE   High blood pressure causes heart disease and increases the risk of stroke. High blood pressure is more likely to develop in:  People who have blood pressure in the high end   of the normal range (130-139/85-89 mm Hg).  People who are overweight or obese.  People who are African American.  If you are 38-23 years of age, have your blood pressure checked every 3-5 years. If you are 61 years of age or older, have your blood pressure checked every year. You should have your blood pressure measured twice--once when you are at a hospital or clinic, and once when you are not at a hospital or clinic. Record the average of the two measurements. To check your blood pressure when you are not at a hospital or clinic, you can use:  An automated blood pressure machine at a pharmacy.  A home blood pressure monitor.  If you are between 45 years and 39 years old, ask your health care provider if you should take aspirin to prevent strokes.  Have regular diabetes screenings. This involves taking a blood sample to check your fasting blood sugar level.  If you are at a normal weight and have a low risk for diabetes,  have this test once every three years after 80 years of age.  If you are overweight and have a high risk for diabetes, consider being tested at a younger age or more often. PREVENTING INFECTION  Hepatitis B  If you have a higher risk for hepatitis B, you should be screened for this virus. You are considered at high risk for hepatitis B if:  You were born in a country where hepatitis B is common. Ask your health care provider which countries are considered high risk.  Your parents were born in a high-risk country, and you have not been immunized against hepatitis B (hepatitis B vaccine).  You have HIV or AIDS.  You use needles to inject street drugs.  You live with someone who has hepatitis B.  You have had sex with someone who has hepatitis B.  You get hemodialysis treatment.  You take certain medicines for conditions, including cancer, organ transplantation, and autoimmune conditions. Hepatitis C  Blood testing is recommended for:  Everyone born from [birth year] through 1963.  Anyone with known risk factors for hepatitis C. Sexually transmitted infections (STIs)  You should be screened for sexually transmitted infections (STIs) including gonorrhea and chlamydia if:  You are sexually active and are younger than 80 years of age.  You are older than 80 years of age and your health care provider tells you that you are at risk for this type of infection.  Your sexual activity has changed since you were last screened and you are at an increased risk for chlamydia or gonorrhea. Ask your health care provider if you are at risk.  If you do not have HIV, but are at risk, it may be recommended that you take a prescription medicine daily to prevent HIV infection. This is called pre-exposure prophylaxis (PrEP). You are considered at risk if:  You are sexually active and do not regularly use condoms or know the HIV status of your partner(s).  You take drugs by injection.  You are sexually  active with a partner who has HIV. Talk with your health care provider about whether you are at high risk of being infected with HIV. If you choose to begin PrEP, you should first be tested for HIV. You should then be tested every 3 months for as long as you are taking PrEP.  PREGNANCY   If you are premenopausal and you may become pregnant, ask your health care provider about preconception counseling.  If you may  become pregnant, take 400 to 800 micrograms (mcg) of folic acid every day.  If you want to prevent pregnancy, talk to your health care provider about birth control (contraception). OSTEOPOROSIS AND MENOPAUSE   Osteoporosis is a disease in which the bones lose minerals and strength with aging. This can result in serious bone fractures. Your risk for osteoporosis can be identified using a bone density scan.  If you are 61 years of age or older, or if you are at risk for osteoporosis and fractures, ask your health care provider if you should be screened.  Ask your health care provider whether you should take a calcium or vitamin D supplement to lower your risk for osteoporosis.  Menopause may have certain physical symptoms and risks.  Hormone replacement therapy may reduce some of these symptoms and risks. Talk to your health care provider about whether hormone replacement therapy is right for you.  HOME CARE INSTRUCTIONS   Schedule regular health, dental, and eye exams.  Stay current with your immunizations.   Do not use any tobacco products including cigarettes, chewing tobacco, or electronic cigarettes.  If you are pregnant, do not drink alcohol.  If you are breastfeeding, limit how much and how often you drink alcohol.  Limit alcohol intake to no more than 1 drink per day for nonpregnant women. One drink equals 12 ounces of beer, 5 ounces of wine, or 1 ounces of hard liquor.  Do not use street drugs.  Do not share needles.  Ask your health care provider for help if  you need support or information about quitting drugs.  Tell your health care provider if you often feel depressed.  Tell your health care provider if you have ever been abused or do not feel safe at home.   This information is not intended to replace advice given to you by your health care provider. Make sure you discuss any questions you have with your health care provider.   Document Released: 03/19/2011 Document Revised: 09/24/2014 Document Reviewed: 08/05/2013 Elsevier Interactive Patient Education [employer].

## 2016-03-10 LAB — CMP14+EGFR
A/G RATIO: 2 (ref 1.2–2.2)
ALBUMIN: 4.3 g/dL (ref 3.5–4.7)
ALK PHOS: 73 IU/L (ref 39–117)
ALT: 10 IU/L (ref 0–32)
AST: 19 IU/L (ref 0–40)
BILIRUBIN TOTAL: 0.3 mg/dL (ref 0.0–1.2)
BUN / CREAT RATIO: 16 (ref 12–28)
BUN: 14 mg/dL (ref 8–27)
CHLORIDE: 102 mmol/L (ref 96–106)
CO2: 23 mmol/L (ref 18–29)
Calcium: 9.5 mg/dL (ref 8.7–10.3)
Creatinine, Ser: 0.86 mg/dL (ref 0.57–1.00)
GFR calc non Af Amer: 64 mL/min/{1.73_m2} (ref >59)
GFR, EST AFRICAN AMERICAN: 73 mL/min/{1.73_m2} (ref >59)
Globulin, Total: 2.2 g/dL (ref 1.5–4.5)
Glucose: 100 mg/dL — ABNORMAL HIGH (ref 65–99)
POTASSIUM: 4.9 mmol/L (ref 3.5–5.2)
Sodium: 140 mmol/L (ref 134–144)
Total Protein: 6.5 g/dL (ref 6.0–8.5)

## 2016-03-10 LAB — LIPID PANEL
Chol/HDL Ratio: 2.9 ratio units (ref 0.0–4.4)
Cholesterol, Total: 224 mg/dL — ABNORMAL HIGH (ref 100–199)
HDL: 76 mg/dL (ref >39)
LDL Calculated: 132 mg/dL — ABNORMAL HIGH (ref 0–99)
Triglycerides: 78 mg/dL (ref 0–149)
VLDL Cholesterol Cal: 16 mg/dL (ref 5–40)

## 2016-03-10 LAB — THYROID PANEL WITH TSH
FREE THYROXINE INDEX: 1.8 (ref 1.2–4.9)
T3 UPTAKE RATIO: 23 % — AB (ref 24–39)
T4 TOTAL: 7.7 ug/dL (ref 4.5–12.0)
TSH: 1.28 u[IU]/mL (ref 0.450–4.500)

## 2016-03-13 ENCOUNTER — Other Ambulatory Visit: Payer: Self-pay | Admitting: Family

## 2016-03-15 ENCOUNTER — Encounter: Payer: Self-pay | Admitting: *Deleted

## 2016-03-23 ENCOUNTER — Telehealth: Payer: Self-pay | Admitting: *Deleted

## 2016-03-23 NOTE — Telephone Encounter (Signed)
Prior to January labs, pt had been taking Lovastatin Pt understood she was to stop taking Lovastatin because her cholesterol numbers were normal Now that cholesterol numbers are elevated, do you want to resume Lovastatin Please review and advise

## 2016-03-25 NOTE — Telephone Encounter (Signed)
No we will continue to monitor. Pt needs to be on low fat diet and exercise

## 2016-03-26 ENCOUNTER — Telehealth: Payer: Self-pay | Admitting: Family

## 2016-03-27 NOTE — Telephone Encounter (Signed)
Pt was advised we would monitor her for now and no need to start on meds but pt states she only wants to speak with [facility] regarding this.

## 2016-03-27 NOTE — Telephone Encounter (Signed)
Nurse to reorder statin

## 2016-03-28 ENCOUNTER — Ambulatory Visit (INDEPENDENT_AMBULATORY_CARE_PROVIDER_SITE_OTHER): Payer: Medicare Other | Admitting: Family Medicine

## 2016-03-28 ENCOUNTER — Encounter: Payer: Self-pay | Admitting: Family Medicine

## 2016-03-28 VITALS — BP 130/64 | HR 69 | Temp 97.5°F | Ht 60.0 in | Wt 104.8 lb

## 2016-03-28 DIAGNOSIS — R350 Frequency of micturition: Secondary | ICD-10-CM

## 2016-03-28 DIAGNOSIS — I10 Essential (primary) hypertension: Secondary | ICD-10-CM

## 2016-03-28 DIAGNOSIS — E785 Hyperlipidemia, unspecified: Secondary | ICD-10-CM | POA: Diagnosis not present

## 2016-03-28 LAB — URINALYSIS, COMPLETE
BILIRUBIN UA: NEGATIVE
GLUCOSE, UA: NEGATIVE
Ketones, UA: NEGATIVE
Nitrite, UA: NEGATIVE
PH UA: 5.5 (ref 5.0–7.5)
Protein, UA: NEGATIVE
RBC, UA: NEGATIVE
Specific Gravity, UA: 1.01 (ref 1.005–1.030)
UUROB: 0.2 mg/dL (ref 0.2–1.0)

## 2016-03-28 LAB — MICROSCOPIC EXAMINATION

## 2016-03-28 NOTE — Progress Notes (Signed)
Subjective:    Patient ID: April Schneider, female    DOB: April 18, 1933, 82 y.o.   MRN: [id number]  HPI Patient is here today complaining with burning when she urinates and frequent urination. Patient also wants to discuss the panic attacks she is having. Patient is mostly upset about being taking off her cholesterol medicine. We spent lots of time talking about evidence medicine primary and secondary prevention think this probably went over her head. Finally a pullout the findings showing her the cardiovascular risk assessment formula and as we applied her numbers it was suggested (despite her age of 82) that she might benefit from lipid lowering medicine so I reluctantly said to restart area We also talked about testing of thyroid I told her that once a year is recommended unless we change the dose I'm not sure she really is into that but once a year on both lipids and TSH are my normal custom unless dosages were changed. She also has what she calls a corn on the bottom of her right heel. Rather the coordinates a plantars wart.   Review of Systems  Constitutional: Negative.   HENT: Negative.   Eyes: Negative.   Respiratory: Negative.   Cardiovascular: Negative.   Gastrointestinal: Negative.   Endocrine: Negative.   Genitourinary: Positive for dysuria and frequency.  Musculoskeletal: Negative.   Skin: Negative.   Allergic/Immunologic: Negative.   Neurological: Negative.   Hematological: Negative.   Psychiatric/Behavioral: Negative.        Panic Attacks.     Depression screen [facility] 2/9 03/28/2016 03/09/2016 11/03/2015 04/14/2015 09/27/2014  Decreased Interest 0 0 0 0 0  Down, Depressed, Hopeless 0 0 0 0 0  PHQ - 2 Score 0 0 0 0 0          Patient Active Problem List   Diagnosis Date Noted  . Stroke-like symptoms 11/05/2015  . GERD (gastroesophageal reflux disease)   . Hyperlipidemia   . Hypertension   . Tinnitus   . Vertigo   . Hypothyroidism 10/27/2013  . GAD  (generalized anxiety disorder) 10/27/2013   Outpatient Encounter Prescriptions as of 03/28/2016  Medication Sig  . acetaminophen (TYLENOL) 160 MG/5ML suspension Take 320 mg by mouth every 6 (six) hours as needed for mild pain, moderate pain or headache. Reported on 12/16/2015  . amLODipine (NORVASC) 5 MG tablet TAKE 1 TABLET BY MOUTH EVERY DAY  . levothyroxine (SYNTHROID, LEVOTHROID) 50 MCG tablet Take 1 tablet (50 mcg total) by mouth daily.  . meclizine (ANTIVERT) 25 MG tablet Take 1 tablet (25 mg total) by mouth 3 (three) times daily as needed for dizziness.  . metoprolol tartrate (LOPRESSOR) 25 MG tablet Take 0.5 tablets (12.5 mg total) by mouth daily.   No facility-administered encounter medications on file as of 03/28/2016.       Objective:   Physical Exam  Constitutional: She appears well-developed and well-nourished.  Cardiovascular: Normal rate.   Pulmonary/Chest: Effort normal and breath sounds normal.  Skin:  The plantar wart was pared with a #15 blade and frozen with liquid nitrogen. I have asked her to return in 2 weeks for a second treatment   BP 130/64 mmHg  Pulse 69  Temp(Src) 97.5 F (36.4 C) (Oral)  Ht 5' (1.524 m)  Wt 104 lb 12.8 oz (47.537 kg)  BMI 20.47 kg/m2        Assessment & Plan:  1. Frequent urination Ounces is unremarkable. A vascular to drink more water - Urinalysis, Complete - Urine culture  2. Essential hypertension Pressure is well controlled on current regimen  3. Hyperlipidemia Restart Mevacor and recheck lipids in 2 months  Wardell Honour MD

## 2016-04-01 LAB — URINE CULTURE

## 2016-04-10 MED ORDER — NITROFURANTOIN MONOHYD MACRO 100 MG PO CAPS: 100.0000 mg | ORAL_CAPSULE | Freq: Two times a day (BID) | ORAL | 0 refills | Status: DC

## 2016-04-10 NOTE — Addendum Note (Signed)
Addended by: Wardell Heath on: 04/10/2016 11:17 AM   Modules accepted: Orders

## 2016-04-12 ENCOUNTER — Other Ambulatory Visit: Payer: Self-pay | Admitting: Family

## 2016-04-13 NOTE — Telephone Encounter (Signed)
Per [facility] last filled 09/12/2016  #30 Pt requesting refill due to panic attack this AM She has enough to last until Alyse Low comes back on Tuesday Please review and advise

## 2016-04-16 NOTE — Telephone Encounter (Signed)
Rx called in 

## 2016-04-29 DIAGNOSIS — Z7982 Long term (current) use of aspirin: Secondary | ICD-10-CM | POA: Diagnosis not present

## 2016-04-29 DIAGNOSIS — W57XXXA Bitten or stung by nonvenomous insect and other nonvenomous arthropods, initial encounter: Secondary | ICD-10-CM | POA: Diagnosis not present

## 2016-04-29 DIAGNOSIS — S70362A Insect bite (nonvenomous), left thigh, initial encounter: Secondary | ICD-10-CM | POA: Diagnosis not present

## 2016-04-29 DIAGNOSIS — Z79899 Other long term (current) drug therapy: Secondary | ICD-10-CM | POA: Diagnosis not present

## 2016-04-29 DIAGNOSIS — I10 Essential (primary) hypertension: Secondary | ICD-10-CM | POA: Diagnosis not present

## 2016-04-29 DIAGNOSIS — S80862A Insect bite (nonvenomous), left lower leg, initial encounter: Secondary | ICD-10-CM | POA: Diagnosis not present

## 2016-04-30 ENCOUNTER — Telehealth: Payer: Self-pay | Admitting: Family

## 2016-04-30 NOTE — Telephone Encounter (Signed)
Patient aware.

## 2016-04-30 NOTE — Telephone Encounter (Signed)
THis is ok

## 2016-04-30 NOTE — Telephone Encounter (Signed)
Patient states she went to [facility] ER lastnight for tick bite and was placed on doxy 100mg  BID. Patient wants to make sure she has not been on too much antibiotic since she was on two the last few months for UTI. Patient also wanted to make you aware of what was going on.

## 2016-05-04 ENCOUNTER — Telehealth: Payer: Self-pay | Admitting: Family

## 2016-05-04 NOTE — Telephone Encounter (Signed)
Patient went to ER last Sunday for a tick bite and was rxd doxcycline 100mg  BID for 21 days. Patient is concerned that this is too much medication. Advised patient that this is normal treatment for tick bites and as long as she is not having any complications she should continue to finish ABT. Patient verbalized understanding

## 2016-06-03 ENCOUNTER — Other Ambulatory Visit: Payer: Self-pay | Admitting: Family

## 2016-06-20 ENCOUNTER — Ambulatory Visit: Payer: Medicare Other

## 2016-06-28 ENCOUNTER — Other Ambulatory Visit: Payer: Self-pay | Admitting: Family

## 2016-07-09 ENCOUNTER — Telehealth: Payer: Self-pay | Admitting: Family

## 2016-07-09 ENCOUNTER — Other Ambulatory Visit: Payer: Self-pay | Admitting: Family

## 2016-07-09 DIAGNOSIS — R42 Dizziness and giddiness: Secondary | ICD-10-CM

## 2016-07-09 MED ORDER — MECLIZINE HCL 25 MG PO TABS: 12.5000 mg | ORAL_TABLET | Freq: Three times a day (TID) | ORAL | 0 refills | Status: AC | PRN

## 2016-07-09 NOTE — Telephone Encounter (Signed)
Pt aware Rx sent to pharmacy 

## 2016-07-09 NOTE — Telephone Encounter (Signed)
Closing encounter. Taken care of in another encounter

## 2016-07-09 NOTE — Telephone Encounter (Signed)
Pt is having a dizzy spell, her meclizine has expired. She doesn't need a too many tablets if she stays on this medication. Or is there a lower dose or something else she can take OTC with her other medications

## 2016-07-09 NOTE — Telephone Encounter (Signed)
Antivert Prescription sent to pharmacy   

## 2016-07-10 ENCOUNTER — Telehealth: Payer: Self-pay | Admitting: Family

## 2016-07-10 NOTE — Telephone Encounter (Signed)
Patient calling to check on refill of Meclizine.  Had called yesterday and was told prescription would be sent in.  Went to pharmacy to pick up and was not there.  Appears prescription was sent in yesterday by [facility].  I contacted [facility] and they did not receive.  Prescription for Meclizine 25 mg 1/2 - 1 TID prn dizziness, #30, with no refills given to pharmacist.

## 2016-07-30 ENCOUNTER — Other Ambulatory Visit: Payer: Self-pay | Admitting: Family

## 2016-08-30 ENCOUNTER — Telehealth: Payer: Self-pay | Admitting: Family

## 2016-08-30 NOTE — Telephone Encounter (Signed)
Pt given appt with Evelina Dun 12/27 at 2:25 for chronic follow up and bloodwork.

## 2016-09-12 ENCOUNTER — Encounter: Payer: Self-pay | Admitting: Family

## 2016-09-12 ENCOUNTER — Ambulatory Visit (INDEPENDENT_AMBULATORY_CARE_PROVIDER_SITE_OTHER): Payer: Medicare Other | Admitting: Family

## 2016-09-12 VITALS — BP 133/68 | HR 63 | Temp 97.8°F | Ht 60.0 in | Wt 105.2 lb

## 2016-09-12 DIAGNOSIS — E785 Hyperlipidemia, unspecified: Secondary | ICD-10-CM

## 2016-09-12 DIAGNOSIS — F411 Generalized anxiety disorder: Secondary | ICD-10-CM

## 2016-09-12 DIAGNOSIS — E039 Hypothyroidism, unspecified: Secondary | ICD-10-CM

## 2016-09-12 DIAGNOSIS — R42 Dizziness and giddiness: Secondary | ICD-10-CM | POA: Diagnosis not present

## 2016-09-12 DIAGNOSIS — K219 Gastro-esophageal reflux disease without esophagitis: Secondary | ICD-10-CM | POA: Diagnosis not present

## 2016-09-12 DIAGNOSIS — I10 Essential (primary) hypertension: Secondary | ICD-10-CM | POA: Diagnosis not present

## 2016-09-12 NOTE — Progress Notes (Signed)
Subjective:    Patient ID: April Schneider, female    DOB: [birth year], 82 y.o.   MRN: [id number]  PT presents to the office today for chronic follow up.  Hypertension  This is a chronic problem. The current episode started more than 1 year ago. The problem has been resolved since onset. The problem is controlled. Associated symptoms include anxiety. Pertinent negatives include no headaches, palpitations, peripheral edema or shortness of breath. Risk factors for coronary artery disease include dyslipidemia and post-menopausal state. Past treatments include beta blockers. The current treatment provides moderate improvement. Hypertensive end-organ damage includes a thyroid problem. There is no history of kidney disease, CAD/MI, CVA or heart failure.  Hyperlipidemia  This is a chronic problem. The current episode started more than 1 year ago. The problem is uncontrolled. Recent lipid tests were reviewed and are high. Pertinent negatives include no shortness of breath. Current antihyperlipidemic treatment includes diet change and statins. The current treatment provides moderate improvement of lipids. Risk factors for coronary artery disease include dyslipidemia, hypertension, post-menopausal and family history.  Thyroid Problem  Presents for follow-up visit. Symptoms include anxiety and hoarse voice. Patient reports no depressed mood, diarrhea, hair loss, palpitations or weight gain. The symptoms have been stable. Past treatments include levothyroxine. The treatment provided moderate relief. Her past medical history is significant for hyperlipidemia. There is no history of heart failure.  Anxiety  Presents for follow-up visit. Symptoms include dizziness, excessive worry and nervous/anxious behavior. Patient reports no depressed mood, irritability, palpitations, shortness of breath or suicidal ideas. Symptoms occur occasionally. The symptoms are aggravated by specific phobias. The quality of sleep is  good. Nighttime awakenings: none.   Her past medical history is significant for anxiety/panic attacks. Past treatments include lifestyle changes. The treatment provided moderate relief. Compliance with prior treatments has been good.  Dizziness  This is a chronic problem. The current episode started more than 1 year ago. The problem occurs intermittently. The problem has been waxing and waning. Pertinent negatives include no headaches. The symptoms are aggravated by bending. Treatments tried: antivert. The treatment provided moderate relief.      Review of Systems  Constitutional: Negative.  Negative for irritability and weight gain.  HENT: Positive for hoarse voice.   Eyes: Negative.   Respiratory: Negative.  Negative for shortness of breath.   Cardiovascular: Negative.  Negative for palpitations.  Gastrointestinal: Negative.  Negative for diarrhea.  Endocrine: Negative.   Genitourinary: Negative.   Musculoskeletal: Negative.   Neurological: Positive for dizziness. Negative for headaches.  Hematological: Negative.   Psychiatric/Behavioral: Negative for suicidal ideas. The patient is nervous/anxious.   All other systems reviewed and are negative.      Objective:   Physical Exam  Constitutional: She is oriented to person, place, and time. She appears well-developed and well-nourished. No distress.  HENT:  Head: Normocephalic and atraumatic.  Right Ear: External ear normal.  Left Ear: External ear normal.  Nose: Nose normal.  Mouth/Throat: Oropharynx is clear and moist.  Eyes: Pupils are equal, round, and reactive to light.  Neck: Normal range of motion. Neck supple. No thyromegaly present.  Cardiovascular: Normal rate, regular rhythm, normal heart sounds and intact distal pulses.   No murmur heard. Pulmonary/Chest: Effort normal and breath sounds normal. No respiratory distress. She has no wheezes.  Abdominal: Soft. Bowel sounds are normal. She exhibits no distension. There is  no tenderness.  Musculoskeletal: Normal range of motion. She exhibits no edema or tenderness.  Neurological: She is alert  and oriented to person, place, and time.  Skin: Skin is warm and dry.  Psychiatric: She has a normal mood and affect. Her behavior is normal. Judgment and thought content normal.  Vitals reviewed.   BP 133/68   Pulse 63   Temp 97.8 F (36.6 C) (Oral)   Ht 5' (1.524 m)   Wt 105 lb 3.2 oz (47.7 kg)   BMI 20.55 kg/m        Assessment & Plan:  1. Essential hypertension - CMP14+EGFR  2. Gastroesophageal reflux disease, esophagitis presence not specified - CMP14+EGFR  3. Hypothyroidism, unspecified type - CMP14+EGFR - Thyroid Panel With TSH  4. Hyperlipidemia, unspecified hyperlipidemia type - CMP14+EGFR - Lipid panel  5. Vertigo - CMP14+EGFR  6. GAD (generalized anxiety disorder) - CMP14+EGFR   Continue all meds Labs pending Health Maintenance reviewed Diet and exercise encouraged RTO 6 months   Evelina Dun, FNP

## 2016-09-12 NOTE — Patient Instructions (Signed)

## 2016-09-13 ENCOUNTER — Other Ambulatory Visit: Payer: Self-pay | Admitting: Family

## 2016-09-13 LAB — CMP14+EGFR
ALBUMIN: 4.1 g/dL (ref 3.5–4.7)
ALT: 10 IU/L (ref 0–32)
AST: 18 IU/L (ref 0–40)
Albumin/Globulin Ratio: 1.8 (ref 1.2–2.2)
Alkaline Phosphatase: 76 IU/L (ref 39–117)
BILIRUBIN TOTAL: 0.2 mg/dL (ref 0.0–1.2)
BUN / CREAT RATIO: 16 (ref 12–28)
BUN: 15 mg/dL (ref 8–27)
CO2: 24 mmol/L (ref 18–29)
CREATININE: 0.95 mg/dL (ref 0.57–1.00)
Calcium: 9.6 mg/dL (ref 8.7–10.3)
Chloride: 102 mmol/L (ref 96–106)
GFR, EST AFRICAN AMERICAN: 65 mL/min/{1.73_m2} (ref >59)
GFR, EST NON AFRICAN AMERICAN: 56 mL/min/{1.73_m2} — AB (ref >59)
Globulin, Total: 2.3 g/dL (ref 1.5–4.5)
Glucose: 99 mg/dL (ref 65–99)
Potassium: 5.1 mmol/L (ref 3.5–5.2)
Sodium: 142 mmol/L (ref 134–144)
TOTAL PROTEIN: 6.4 g/dL (ref 6.0–8.5)

## 2016-09-13 LAB — THYROID PANEL WITH TSH
Free Thyroxine Index: 1.8 (ref 1.2–4.9)
T3 Uptake Ratio: 24 % (ref 24–39)
T4 TOTAL: 7.4 ug/dL (ref 4.5–12.0)
TSH: 6.17 u[IU]/mL — AB (ref 0.450–4.500)

## 2016-09-13 LAB — LIPID PANEL
CHOL/HDL RATIO: 2.4 ratio (ref 0.0–4.4)
Cholesterol, Total: 169 mg/dL (ref 100–199)
HDL: 70 mg/dL (ref >39)
LDL CALC: 85 mg/dL (ref 0–99)
Triglycerides: 72 mg/dL (ref 0–149)
VLDL CHOLESTEROL CAL: 14 mg/dL (ref 5–40)

## 2016-09-13 MED ORDER — LEVOTHYROXINE SODIUM 75 MCG PO TABS: 75.0000 ug | ORAL_TABLET | Freq: Every day | ORAL | 1 refills | Status: DC

## 2016-09-19 ENCOUNTER — Telehealth: Payer: Self-pay | Admitting: Family

## 2016-09-19 NOTE — Telephone Encounter (Signed)
Pt notified of results Verbalizes understanding 

## 2016-09-26 ENCOUNTER — Other Ambulatory Visit: Payer: Self-pay | Admitting: Family

## 2016-10-26 ENCOUNTER — Other Ambulatory Visit: Payer: Self-pay | Admitting: Family

## 2016-10-31 ENCOUNTER — Telehealth: Payer: Self-pay | Admitting: Family

## 2016-10-31 NOTE — Telephone Encounter (Signed)
Busy 2/14-jhb

## 2016-11-01 IMAGING — MR MR HEAD W/O CM
9 of 12 series · 32 of 48 positions shown · non-contrast
Comparison: CT head 11/05/2015

CLINICAL DATA: Stroke like symptoms.  Vertigo.

EXAM:
MRI HEAD WITHOUT CONTRAST
MRA HEAD WITHOUT CONTRAST
TECHNIQUE: Multiplanar, multiecho pulse sequences of the brain and surrounding
structures were obtained without intravenous contrast. Angiographic
images of the head were obtained using MRA technique without
contrast.

[Series 4: DWI · axial · 3.0mm · 1.09mm/px · z∈[-34,+107]mm · 7 of 96 slices shown (1 of 4)]
[im 1/96]
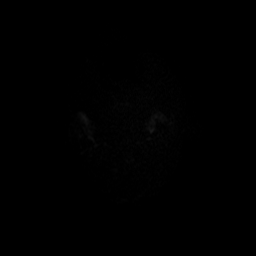
[im 16/96]
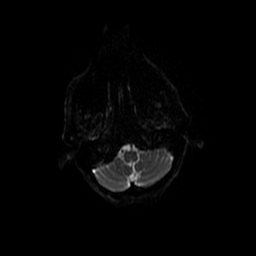
[im 32/96]
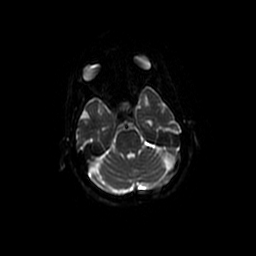
[im 48/96]
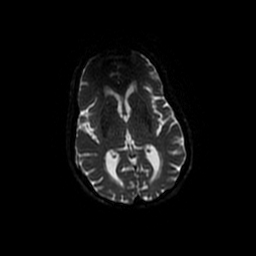
[im 64/96]
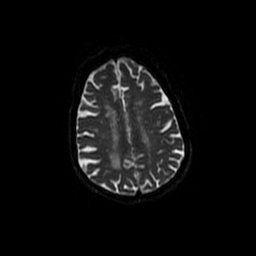
[im 80/96]
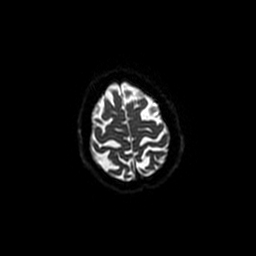
[im 96/96]
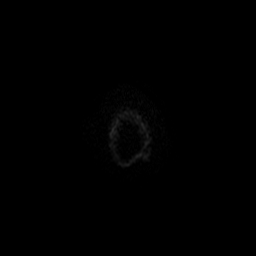

[Series 5: DWI · coronal · 5.0mm · 1.09mm/px · 4 of 68 slices shown (2 of 4)]
[im 1/68]
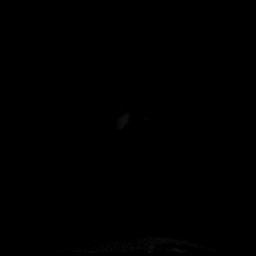
[im 23/68]
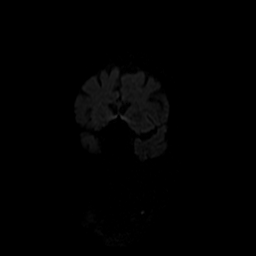
[im 45/68]
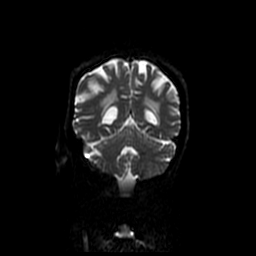
[im 68/68]
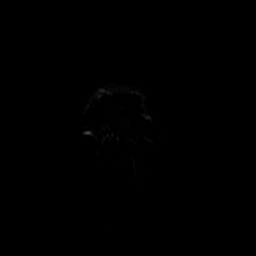

[Series 6: (id) mt fs · axial · 1.4mm · 0.43mm/px · z∈[-29,+37]mm · 6 of 136 slices shown]
[im 1/136]
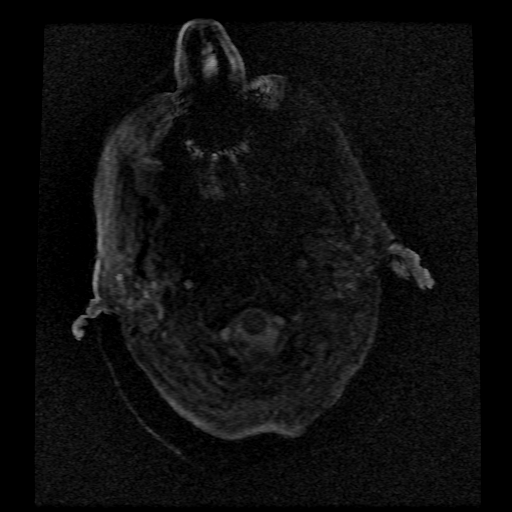
[im 28/136]
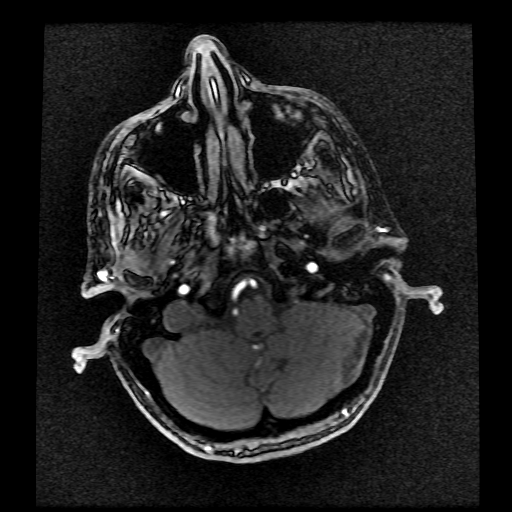
[im 41/136]
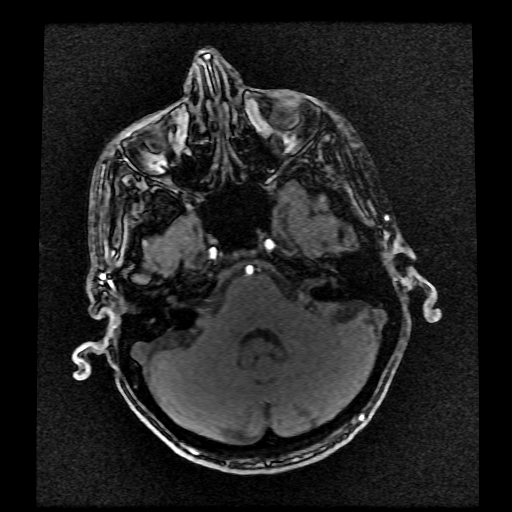
[im 55/136]
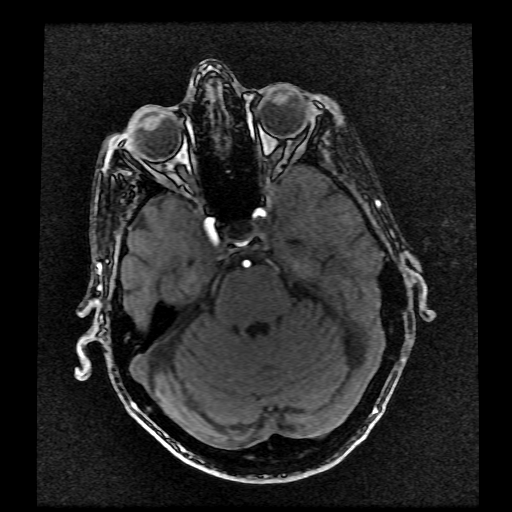
[im 82/136]
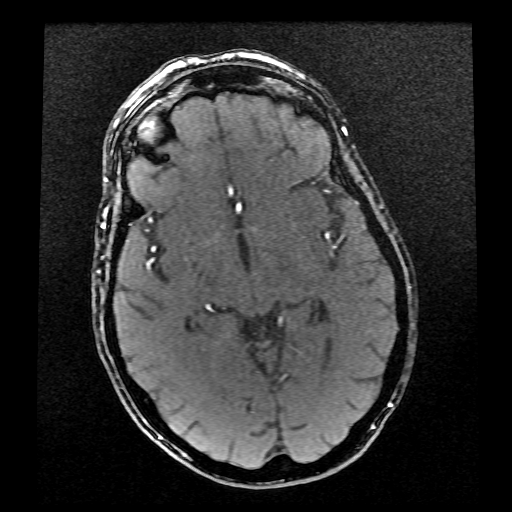
[im 95/136]
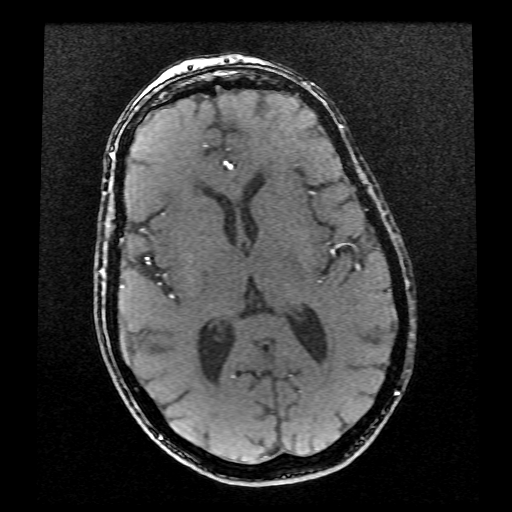

[Series 7: T1 · sagittal · 5.0mm · 0.47mm/px · 2 of 23 slices shown]
[im 1/23]
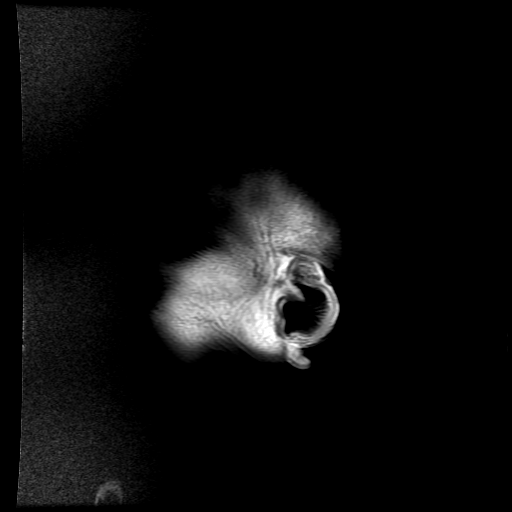
[im 23/23]
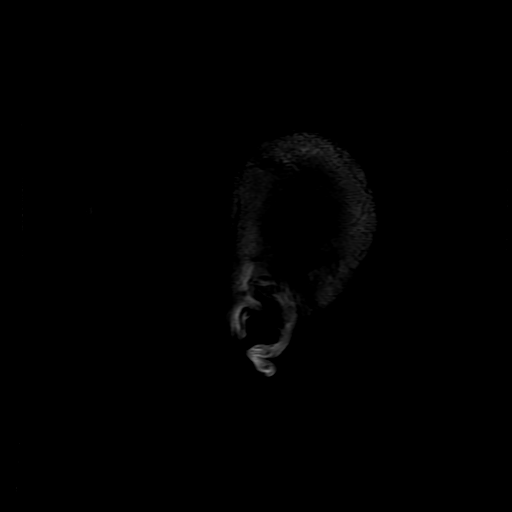

[Series 8: T2 · axial · 5.0mm · 0.43mm/px · z∈[-37,+107]mm · 2 of 25 slices shown (1 of 2)]
[im 1/25]
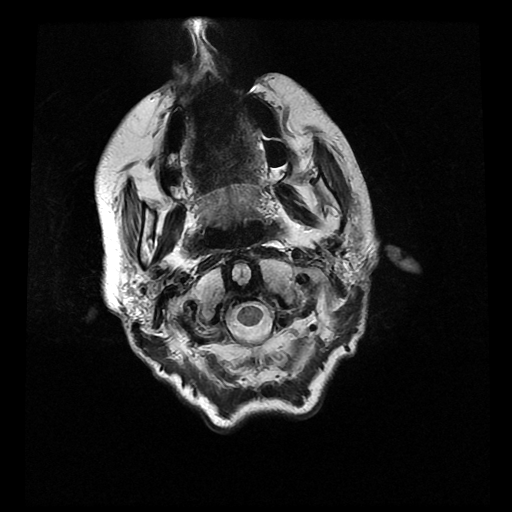
[im 25/25]
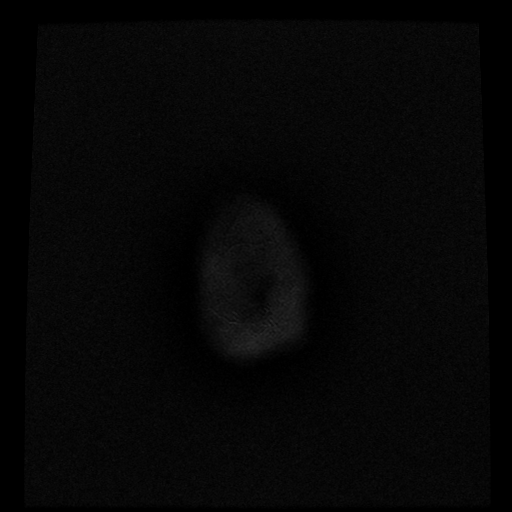

[Series 9: FLAIR · axial · 5.0mm · 0.43mm/px · z∈[-37,+107]mm · 2 of 25 slices shown]
[im 1/25]
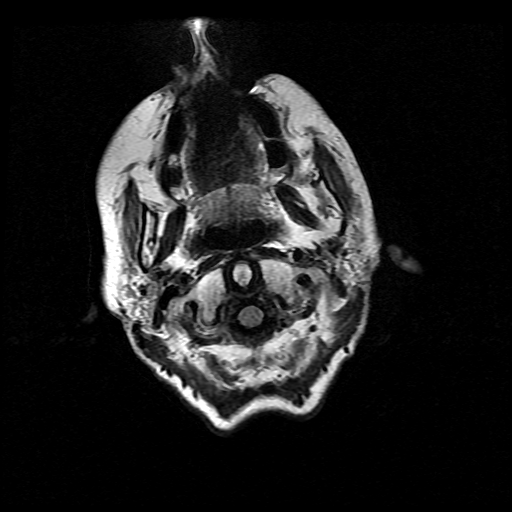
[im 25/25]
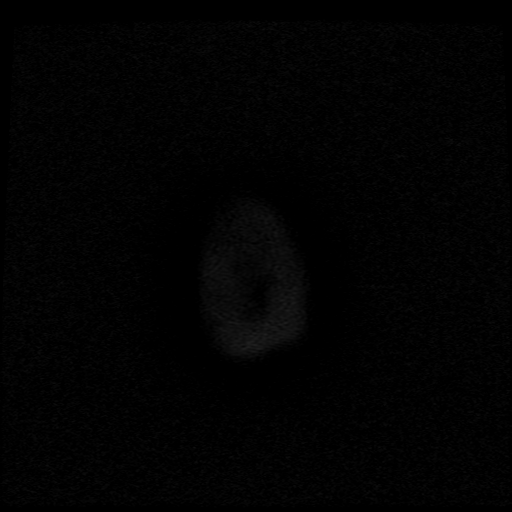

[Series 14: T2 · coronal · 5.0mm · 0.43mm/px · 2 of 28 slices shown (2 of 2)]
[im 1/28]
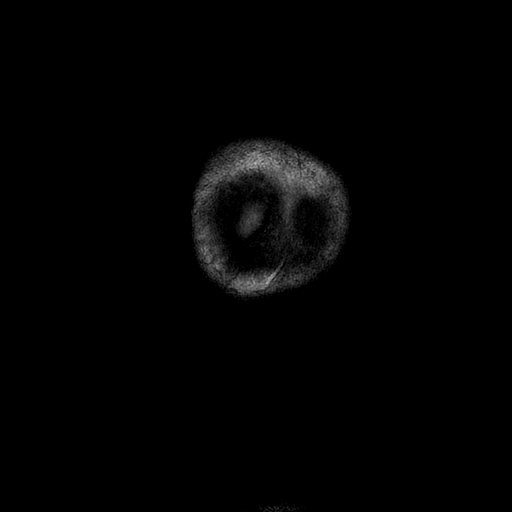
[im 28/28]
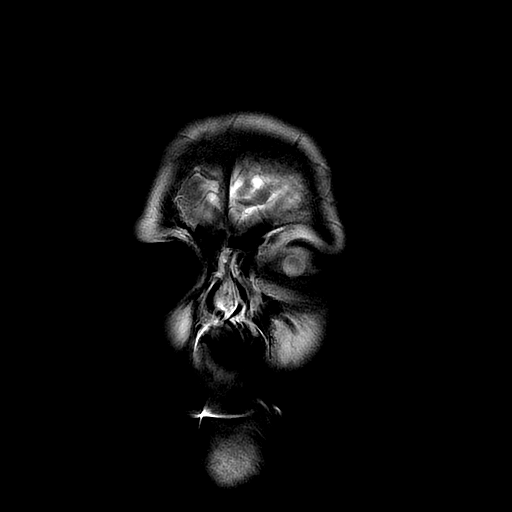

[Series 400: DWI · axial · 3.0mm · 1.09mm/px · z∈[-34,+107]mm · 4 of 48 slices shown (3 of 4)]
[im 1/48]
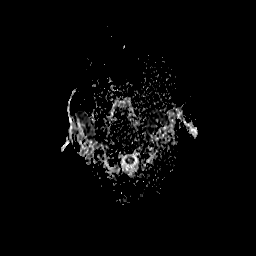
[im 16/48]
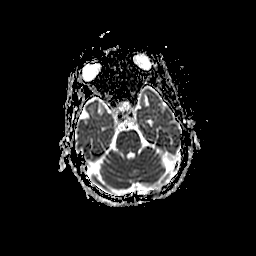
[im 32/48]
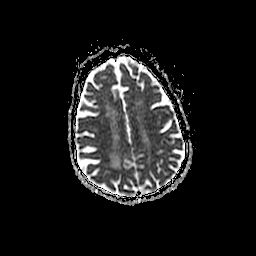
[im 48/48]
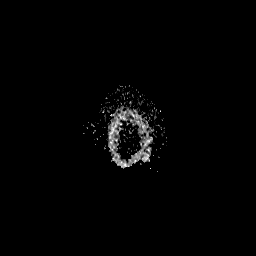

[Series 500: DWI · coronal · 5.0mm · 1.09mm/px · 3 of 34 slices shown (4 of 4)]
[im 1/34]
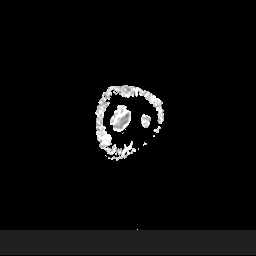
[im 17/34]
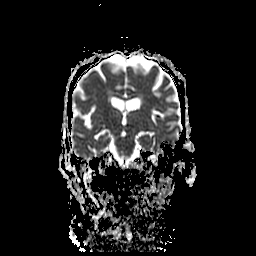
[im 34/34]
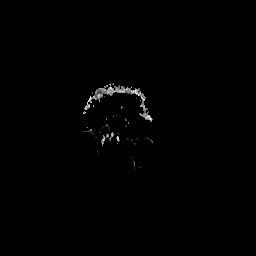

[32 of 48 positions shown; findings below may reference images not displayed]

FINDINGS: MRI HEAD FINDINGS

Negative for acute infarct.

Moderate chronic microvascular ischemic change in the white matter.

Mild atrophy.  Negative for hydrocephalus.

Negative for intracranial hemorrhage. Negative for mass or edema. No
shift of the midline structures.

MRA HEAD FINDINGS

Both vertebral arteries patent to the basilar. Right PICA patent.
Left PICA not visualized. Two left AICA vessels are patent. Basilar
widely patent. Superior cerebellar and posterior cerebral arteries
patent bilaterally

Internal carotid artery patent bilaterally. Anterior and middle
cerebral arteries appear normal

Negative for cerebral aneurysm.
IMPRESSION: Negative for acute infarct.

Moderate chronic microvascular ischemic change

Negative MRA head

## 2016-11-06 NOTE — Telephone Encounter (Signed)
NA jkp 2/20

## 2016-11-16 NOTE — Telephone Encounter (Signed)
No further calls from patient.

## 2016-11-28 ENCOUNTER — Encounter: Payer: Self-pay | Admitting: Family

## 2016-11-28 ENCOUNTER — Ambulatory Visit (INDEPENDENT_AMBULATORY_CARE_PROVIDER_SITE_OTHER): Payer: Medicare Other | Admitting: Family

## 2016-11-28 VITALS — BP 123/64 | HR 67 | Temp 97.0°F | Ht 60.0 in | Wt 102.6 lb

## 2016-11-28 DIAGNOSIS — M5441 Lumbago with sciatica, right side: Secondary | ICD-10-CM

## 2016-11-28 MED ORDER — NAPROXEN 500 MG PO TABS: 500.0000 mg | ORAL_TABLET | Freq: Two times a day (BID) | ORAL | 0 refills | Status: DC

## 2016-11-28 MED ORDER — PREDNISONE 10 MG (21) PO TBPK: ORAL_TABLET | ORAL | 0 refills | Status: DC

## 2016-11-28 MED ORDER — LEVOTHYROXINE SODIUM 75 MCG PO TABS: 75.0000 ug | ORAL_TABLET | Freq: Every day | ORAL | 1 refills | Status: DC

## 2016-11-28 NOTE — Progress Notes (Signed)
   Subjective:    Patient ID: April Schneider, female    DOB: 09-04-1933, 83 y.o.   MRN: [id number]  Back Pain  This is a new problem. The current episode started 1 to 4 weeks ago. The problem occurs constantly. The problem has been waxing and waning since onset. The pain is present in the gluteal and lumbar spine. The quality of the pain is described as aching. The pain radiates to the left thigh and left knee. The pain is at a severity of 10/10. The pain is moderate. The symptoms are aggravated by standing and bending. Associated symptoms include leg pain and numbness. Pertinent negatives include no bladder incontinence, bowel incontinence or weakness. Risk factors include poor posture and obesity. Treatments tried: tylenol. The treatment provided mild relief.      Review of Systems  Gastrointestinal: Negative for bowel incontinence.  Genitourinary: Negative for bladder incontinence.  Musculoskeletal: Positive for back pain.  Neurological: Positive for numbness. Negative for weakness.  All other systems reviewed and are negative.      Objective:   Physical Exam  Constitutional: She is oriented to person, place, and time. She appears well-developed and well-nourished. No distress.  HENT:  Head: Normocephalic.  Cardiovascular: Normal rate, regular rhythm, normal heart sounds and intact distal pulses.   No murmur heard. Pulmonary/Chest: Effort normal and breath sounds normal. No respiratory distress. She has no wheezes.  Abdominal: Soft. Bowel sounds are normal. She exhibits no distension. There is no tenderness.  Musculoskeletal: Normal range of motion. She exhibits tenderness. She exhibits no edema.  Low back pain with stand and bending, negative SLR   Neurological: She is alert and oriented to person, place, and time.  Skin: Skin is warm and dry.  Psychiatric: She has a normal mood and affect. Her behavior is normal. Judgment and thought content normal.  Vitals  reviewed.     BP 123/64   Pulse 67   Temp 97 F (36.1 C) (Oral)   Ht 5' (1.524 m)   Wt 102 lb 9.6 oz (46.5 kg)   BMI 20.04 kg/m      Assessment & Plan:  1. Acute bilateral low back pain with right-sided sciatica Rest Ice and heat ROM exercises discussed Take naprosyn with food No other NSAIDs RTO prn  - predniSONE (STERAPRED UNI-PAK 21 TAB) 10 MG (21) TBPK tablet; Use as directed  Dispense: 21 tablet; Refill: 0 - naproxen (NAPROSYN) 500 MG tablet; Take 1 tablet (500 mg total) by mouth 2 (two) times daily with a meal.  Dispense: 20 tablet; Refill: 0   Evelina Dun, FNP

## 2016-11-28 NOTE — Addendum Note (Signed)
Addended by: Evelina Dun A on: 11/28/2016 04:01 PM   Modules accepted: Orders

## 2016-11-28 NOTE — Patient Instructions (Signed)
Sciatica Rehab Ask your health care provider which exercises are safe for you. Do exercises exactly as told by your health care provider and adjust them as directed. It is normal to feel mild stretching, pulling, tightness, or discomfort as you do these exercises, but you should stop right away if you feel sudden pain or your pain gets worse.Do not begin these exercises until told by your health care provider. Stretching and range of motion exercises These exercises warm up your muscles and joints and improve the movement and flexibility of your hips and your back. These exercises also help to relieve pain, numbness, and tingling. Exercise A: Sciatic nerve glide  1. Sit in a chair with your head facing down toward your chest. Place your hands behind your back. Let your shoulders slump forward. 2. Slowly straighten one of your knees while you tilt your head back as if you are looking toward the ceiling. Only straighten your leg as far as you can without making your symptoms worse. 3. Hold for __________ seconds. 4. Slowly return to the starting position. 5. Repeat with your other leg. Repeat __________ times. Complete this exercise __________ times a day. Exercise B: Knee to chest with hip adduction and internal rotation    1. Lie on your back on a firm surface with both legs straight. 2. Bend one of your knees and move it up toward your chest until you feel a gentle stretch in your lower back and buttock. Then, move your knee toward the shoulder that is on the opposite side from your leg. ? Hold your leg in this position by holding onto the front of your knee. 3. Hold for __________ seconds. 4. Slowly return to the starting position. 5. Repeat with your other leg. Repeat __________ times. Complete this exercise __________ times a day. Exercise C: Prone extension on elbows    1. Lie on your abdomen on a firm surface. A bed may be too soft for this exercise. 2. Prop yourself up on your  elbows. 3. Use your arms to help lift your chest up until you feel a gentle stretch in your abdomen and your lower back. ? This will place some of your body weight on your elbows. If this is uncomfortable, try stacking pillows under your chest. ? Your hips should stay down, against the surface that you are lying on. Keep your hip and back muscles relaxed. 4. Hold for __________ seconds. 5. Slowly relax your upper body and return to the starting position. Repeat __________ times. Complete this exercise __________ times a day. Strengthening exercises These exercises build strength and endurance in your back. Endurance is the ability to use your muscles for a long time, even after they get tired. Exercise D: Pelvic tilt  1. Lie on your back on a firm surface. Bend your knees and keep your feet flat. 2. Tense your abdominal muscles. Tip your pelvis up toward the ceiling and flatten your lower back into the floor. ? To help with this exercise, you may place a small towel under your lower back and try to push your back into the towel. 3. Hold for __________ seconds. 4. Let your muscles relax completely before you repeat this exercise. Repeat __________ times. Complete this exercise __________ times a day. Exercise E: Alternating arm and leg raises    1. Get on your hands and knees on a firm surface. If you are on a hard floor, you may want to use padding to cushion your knees, such as   an exercise mat. 2. Line up your arms and legs. Your hands should be below your shoulders, and your knees should be below your hips. 3. Lift your left leg behind you. At the same time, raise your right arm and straighten it in front of you. ? Do not lift your leg higher than your hip. ? Do not lift your arm higher than your shoulder. ? Keep your abdominal and back muscles tight. ? Keep your hips facing the ground. ? Do not arch your back. ? Keep your balance carefully, and do not hold your breath. 4. Hold for  __________ seconds. 5. Slowly return to the starting position and repeat with your right leg and your left arm. Repeat __________ times. Complete this exercise __________ times a day. Posture and body mechanics    Body mechanics refers to the movements and positions of your body while you do your daily activities. Posture is part of body mechanics. Good posture and healthy body mechanics can help to relieve stress in your body's tissues and joints. Good posture means that your spine is in its natural S-curve position (your spine is neutral), your shoulders are pulled back slightly, and your head is not tipped forward. The following are general guidelines for applying improved posture and body mechanics to your everyday activities. Standing     When standing, keep your spine neutral and your feet about hip-width apart. Keep a slight bend in your knees. Your ears, shoulders, and hips should line up.  When you do a task in which you stand in one place for a long time, place one foot up on a stable object that is 2-4 inches (5-10 cm) high, such as a footstool. This helps keep your spine neutral. Sitting     When sitting, keep your spine neutral and keep your feet flat on the floor. Use a footrest, if necessary, and keep your thighs parallel to the floor. Avoid rounding your shoulders, and avoid tilting your head forward.  When working at a desk or a computer, keep your desk at a height where your hands are slightly lower than your elbows. Slide your chair under your desk so you are close enough to maintain good posture.  When working at a computer, place your monitor at a height where you are looking straight ahead and you do not have to tilt your head forward or downward to look at the screen. Resting     When lying down and resting, avoid positions that are most painful for you.  If you have pain with activities such as sitting, bending, stooping, or squatting (flexion-based  activities), lie in a position in which your body does not bend very much. For example, avoid curling up on your side with your arms and knees near your chest (fetal position).  If you have pain with activities such as standing for a long time or reaching with your arms (extension-based activities), lie with your spine in a neutral position and bend your knees slightly. Try the following positions: ? Lying on your side with a pillow between your knees. ? Lying on your back with a pillow under your knees. Lifting     When lifting objects, keep your feet at least shoulder-width apart and tighten your abdominal muscles.  Bend your knees and hips and keep your spine neutral. It is important to lift using the strength of your legs, not your back. Do not lock your knees straight out.  Always ask for help to lift heavy   or awkward objects. This information is not intended to replace advice given to you by your health care provider. Make sure you discuss any questions you have with your health care provider. Document Released: 09/03/2005 Document Revised: 05/10/2016 Document Reviewed: 05/20/2015 Elsevier Interactive Patient Education  2017 [employer]          

## 2016-11-29 ENCOUNTER — Telehealth: Payer: Self-pay | Admitting: Family

## 2016-11-29 NOTE — Telephone Encounter (Signed)
Pts questions answered.  

## 2016-12-04 ENCOUNTER — Encounter: Payer: Self-pay | Admitting: Family

## 2016-12-04 ENCOUNTER — Ambulatory Visit (INDEPENDENT_AMBULATORY_CARE_PROVIDER_SITE_OTHER): Payer: Medicare Other | Admitting: Family

## 2016-12-04 VITALS — BP 118/58 | HR 72 | Temp 97.9°F | Ht 60.0 in | Wt 101.8 lb

## 2016-12-04 DIAGNOSIS — J101 Influenza due to other identified influenza virus with other respiratory manifestations: Secondary | ICD-10-CM

## 2016-12-04 DIAGNOSIS — B370 Candidal stomatitis: Secondary | ICD-10-CM

## 2016-12-04 DIAGNOSIS — R6889 Other general symptoms and signs: Secondary | ICD-10-CM | POA: Diagnosis not present

## 2016-12-04 DIAGNOSIS — J209 Acute bronchitis, unspecified: Secondary | ICD-10-CM

## 2016-12-04 LAB — VERITOR FLU A/B WAIVED
INFLUENZA A: POSITIVE — AB
INFLUENZA B: NEGATIVE

## 2016-12-04 MED ORDER — NYSTATIN 100000 UNIT/ML MT SUSP: 5.0000 mL | Freq: Four times a day (QID) | OROMUCOSAL | 0 refills | Status: DC

## 2016-12-04 MED ORDER — BENZONATATE 200 MG PO CAPS: 200.0000 mg | ORAL_CAPSULE | Freq: Three times a day (TID) | ORAL | 1 refills | Status: DC | PRN

## 2016-12-04 MED ORDER — OSELTAMIVIR PHOSPHATE 75 MG PO CAPS: 75.0000 mg | ORAL_CAPSULE | Freq: Two times a day (BID) | ORAL | 0 refills | Status: DC

## 2016-12-04 MED ORDER — DOXYCYCLINE HYCLATE 100 MG PO TABS
100.0000 mg | ORAL_TABLET | Freq: Two times a day (BID) | ORAL | 0 refills | Status: DC
Start: 2016-12-04 — End: 2017-04-04

## 2016-12-04 NOTE — Addendum Note (Signed)
Addended by: Evelina Dun A on: 12/04/2016 03:35 PM   Modules accepted: Orders

## 2016-12-04 NOTE — Patient Instructions (Signed)

## 2016-12-04 NOTE — Progress Notes (Signed)
Subjective:    Patient ID: April Schneider, female    DOB: Sep 18, 1932, 83 y.o.   MRN: [id number]  Cough  This is a new problem. The current episode started in the past 7 days. The problem has been gradually worsening. The problem occurs every few minutes. The cough is productive of sputum. Associated symptoms include chills, myalgias, nasal congestion, postnasal drip, rhinorrhea and a sore throat. Pertinent negatives include no ear congestion, ear pain, fever, headaches or wheezing. The symptoms are aggravated by lying down and pollens. She has tried oral steroids and OTC cough suppressant for the symptoms. The treatment provided mild relief. There is no history of asthma.  Diarrhea   Associated symptoms include chills, coughing and myalgias. Pertinent negatives include no fever or headaches.      Review of Systems  Constitutional: Positive for chills. Negative for fever.  HENT: Positive for postnasal drip, rhinorrhea and sore throat. Negative for ear pain.   Respiratory: Positive for cough. Negative for wheezing.   Gastrointestinal: Positive for diarrhea.  Musculoskeletal: Positive for myalgias.  Neurological: Negative for headaches.  All other systems reviewed and are negative.      Objective:   Physical Exam  Constitutional: She is oriented to person, place, and time. She appears well-developed and well-nourished. No distress.  HENT:  Head: Normocephalic and atraumatic.  Right Ear: External ear normal.  Left Ear: External ear normal.  Oral thrush on tongue   Eyes: Pupils are equal, round, and reactive to light.  Neck: Normal range of motion. Neck supple. No thyromegaly present.  Cardiovascular: Normal rate, regular rhythm, normal heart sounds and intact distal pulses.   No murmur heard. Pulmonary/Chest: Effort normal. No respiratory distress. She has decreased breath sounds. She has wheezes.  Coarse nonproductive cough   Abdominal: Soft. Bowel sounds are normal. She  exhibits no distension. There is no tenderness.  Musculoskeletal: Normal range of motion. She exhibits no edema or tenderness.  Neurological: She is alert and oriented to person, place, and time.  Skin: Skin is warm and dry.  Psychiatric: She has a normal mood and affect. Her behavior is normal. Judgment and thought content normal.  Vitals reviewed.     BP (!) 118/58   Pulse 72   Temp 97.9 F (36.6 C) (Oral)   Ht 5' (1.524 m)   Wt 101 lb 12.8 oz (46.2 kg)   BMI 19.88 kg/m      Assessment & Plan:  1. Flu-like symptoms - Veritor Flu A/B Waived  2. Acute bronchitis, unspecified organism - Take meds as prescribed - Use a cool mist humidifier  -Use saline nose sprays frequently -Saline irrigations of the nose can be very helpful if done frequently.  * 4X daily for 1 week*  * Use of a nettie pot can be helpful with this. Follow directions with this* -Force fluids -For any cough or congestion  Use plain Mucinex- regular strength or max strength is fine   * Children- consult with Pharmacist for dosing -For fever or aces or pains- take tylenol or ibuprofen appropriate for age and weight.  * for fevers greater than 101 orally you may alternate ibuprofen and tylenol every  3 hours. -Throat lozenges if help -New toothbrush in 3 days - doxycycline (VIBRA-TABS) 100 MG tablet; Take 1 tablet (100 mg total) by mouth 2 (two) times daily.  Dispense: 20 tablet; Refill: 0  3. Oral thrush -Keep clean and dry - nystatin (MYCOSTATIN) 100000 UNIT/ML suspension; Take 5 mLs (500,000 Units  total) by mouth 4 (four) times daily.  Dispense: 60 mL; Refill: 0  4. Influenza A -Good hand hygiene discussed -Force fluids - oseltamivir (TAMIFLU) 75 MG capsule; Take 1 capsule (75 mg total) by mouth 2 (two) times daily.  Dispense: 10 capsule; Refill: 0   RTO in 1 week  Evelina Dun, FNP

## 2016-12-13 ENCOUNTER — Telehealth: Payer: Self-pay | Admitting: Family

## 2016-12-13 NOTE — Telephone Encounter (Signed)
Aware, she can take naproxyn for leg pain and her cough pill also.

## 2016-12-21 ENCOUNTER — Telehealth: Payer: Self-pay | Admitting: Family

## 2016-12-21 NOTE — Telephone Encounter (Signed)
Patient states she is having continuous left leg pain since being seen 3/20. Recommended patient come back in to be seen again by provider. Appointment made for Monday 12/21/16.

## 2016-12-24 ENCOUNTER — Ambulatory Visit: Payer: Self-pay | Admitting: Family

## 2016-12-26 ENCOUNTER — Other Ambulatory Visit: Payer: Self-pay | Admitting: Family

## 2017-01-28 ENCOUNTER — Telehealth: Payer: Self-pay | Admitting: Family

## 2017-01-28 NOTE — Telephone Encounter (Signed)
No answer on patient's phones.  She should have a refill on thyroid medications.  She needs lab work and office visit around first of July.

## 2017-01-29 ENCOUNTER — Telehealth: Payer: Self-pay | Admitting: Family

## 2017-01-29 DIAGNOSIS — E039 Hypothyroidism, unspecified: Secondary | ICD-10-CM

## 2017-01-29 MED ORDER — LEVOTHYROXINE SODIUM 75 MCG PO TABS: 75.0000 ug | ORAL_TABLET | Freq: Every day | ORAL | 1 refills | Status: DC

## 2017-01-29 NOTE — Telephone Encounter (Signed)
Prescription sent to pharmacy, patient to come in for labwork.

## 2017-01-29 NOTE — Telephone Encounter (Signed)
Patient coming in tomorrow for thyroid labs, needs order placed.  Prescription sent in for Levothyroxine 75 mcg, patient will not pick up until after labwork comes back.

## 2017-01-30 ENCOUNTER — Other Ambulatory Visit: Payer: Medicare Other

## 2017-01-30 DIAGNOSIS — E039 Hypothyroidism, unspecified: Secondary | ICD-10-CM | POA: Diagnosis not present

## 2017-01-31 ENCOUNTER — Other Ambulatory Visit: Payer: Self-pay | Admitting: Family

## 2017-01-31 LAB — THYROID PANEL WITH TSH
FREE THYROXINE INDEX: 3.2 (ref 1.2–4.9)
T3 UPTAKE RATIO: 29 % (ref 24–39)
T4, Total: 10.9 ug/dL (ref 4.5–12.0)
TSH: 0.054 u[IU]/mL — ABNORMAL LOW (ref 0.450–4.500)

## 2017-01-31 MED ORDER — LEVOTHYROXINE SODIUM 50 MCG PO TABS: 50.0000 ug | ORAL_TABLET | Freq: Every day | ORAL | 11 refills | Status: DC

## 2017-02-01 NOTE — Telephone Encounter (Signed)
Patient aware that thyroid level is lower than normal range and this is why medication was decrease to 66mcg

## 2017-02-17 ENCOUNTER — Other Ambulatory Visit: Payer: Self-pay | Admitting: Family

## 2017-03-18 ENCOUNTER — Other Ambulatory Visit: Payer: Self-pay | Admitting: Family

## 2017-03-21 ENCOUNTER — Telehealth: Payer: Self-pay | Admitting: Family

## 2017-03-21 DIAGNOSIS — E039 Hypothyroidism, unspecified: Secondary | ICD-10-CM

## 2017-03-21 NOTE — Telephone Encounter (Signed)
Patient has been losing hair since she made the change from Levothyroxine 75 to 50.  Please advise.

## 2017-03-22 ENCOUNTER — Other Ambulatory Visit: Payer: Self-pay | Admitting: Family

## 2017-03-22 ENCOUNTER — Other Ambulatory Visit: Payer: Medicare Other

## 2017-03-22 DIAGNOSIS — E039 Hypothyroidism, unspecified: Secondary | ICD-10-CM | POA: Diagnosis not present

## 2017-03-22 NOTE — Telephone Encounter (Signed)
Pt aware and will come by today

## 2017-03-22 NOTE — Telephone Encounter (Signed)
Thyroid panel ordered. Pt to come and have lab work and we will adjust medication accordingly.

## 2017-03-23 LAB — THYROID PANEL WITH TSH
Free Thyroxine Index: 2 (ref 1.2–4.9)
T3 UPTAKE RATIO: 24 % (ref 24–39)
T4, Total: 8.3 ug/dL (ref 4.5–12.0)
TSH: 1.36 u[IU]/mL (ref 0.450–4.500)

## 2017-03-29 ENCOUNTER — Telehealth: Payer: Self-pay | Admitting: Family

## 2017-03-29 NOTE — Telephone Encounter (Signed)
Appt made for next week

## 2017-04-04 ENCOUNTER — Ambulatory Visit (INDEPENDENT_AMBULATORY_CARE_PROVIDER_SITE_OTHER): Payer: Medicare Other | Admitting: Family

## 2017-04-04 ENCOUNTER — Encounter: Payer: Self-pay | Admitting: Family

## 2017-04-04 VITALS — BP 131/56 | HR 58 | Temp 97.4°F | Ht 60.0 in | Wt 101.0 lb

## 2017-04-04 DIAGNOSIS — L659 Nonscarring hair loss, unspecified: Secondary | ICD-10-CM | POA: Diagnosis not present

## 2017-04-04 NOTE — Patient Instructions (Signed)
Alopecia Areata, Adult Alopecia areata is a condition that causes you to lose hair. You may lose hair on your scalp in patches. In some cases, you may lose all the hair on your scalp (alopecia totalis) or all the hair from your face and body (alopecia universalis). Alopecia areata is an autoimmune disease. This means that your body's defense system (immune system) mistakes normal parts of the body for germs or other things that can make you sick. When you have alopecia areata, the immune system attacks the hair follicles. Alopecia areata usually develops in childhood, but it can develop at any age. For some people, their hair grows back on its own and hair loss does not happen again. For others, their hair may fall out and grow back in cycles. The hair loss may last many years. Having this condition can be emotionally difficult, but it is not dangerous. What are the causes? The cause of this condition is not known. What increases the risk? This condition is more likely to develop in people who have:  A family history of alopecia.  A family history of another autoimmune disease, including type 1 diabetes and rheumatoid arthritis.  Asthma and allergies.  Down syndrome.  What are the signs or symptoms? Round spots of patchy hair loss on the scalp is the main symptom of this condition. The spots may be mildly itchy. Other symptoms include:  Short dark hairs in the bald patches that are wider at the top (exclamation point hairs).  Dents, white spots, or lines in the fingernails or toenails.  Balding and body hair loss. This is rare.  How is this diagnosed? This condition is diagnosed based on your symptoms and family history. Your health care provider will also check your scalp skin, teeth, and nails. Your health care provider may refer you to a specialist in hair and skin disorders (dermatologist). You may also have tests, including:  A hair pull test.  Blood tests or other screening tests  to check for autoimmune diseases, such as thyroid disease or diabetes.  Skin biopsy to confirm the diagnosis.  A procedure to examine the skin with a lighted magnifying instrument (dermoscopy).  How is this treated? There is no cure for alopecia areata. Treatment is aimed at promoting the regrowth of hair and preventing the immune system from overreacting. No single treatment is right for all people with alopecia areata. It depends on the type of hair loss you have and how severe it is. Work with your health care provider to find the best treatment for you. Treatment may include:  Having regular checkups to make sure the condition is not getting worse (watchful waiting).  Steroid creams or pills for 6-8 weeks to stop the immune reaction and help hair to regrow more quickly.  Other topical medicines to alter the immune system response and support the hair growth cycle.  Steroid injections.  Therapy and counseling with a support group or therapist if you are having trouble coping with hair loss.  Follow these instructions at home:  Learn as much as you can about your condition.  Apply topical creams only as told by your health care provider.  Take over-the-counter and prescription medicines only as told by your health care provider.  Consider getting a wig or products to make hair look fuller or to cover bald spots, if you feel uncomfortable with your appearance.  Get therapy or counseling if you are having a hard time coping with hair loss. Ask your health care  products to make hair look fuller or to cover bald spots, if you feel uncomfortable with your appearance.   Get therapy or counseling if you are having a hard time coping with hair loss. Ask your health care provider to recommend a counselor or support group.   Keep all follow-up visits as told by your health care provider. This is important.  Contact a health care provider if:   Your hair loss gets worse, even with treatment.   You have new symptoms.   You are struggling emotionally.  Summary   Alopecia areata is an autoimmune condition that makes your body's defense system (immune system) attack the hair follicles. This causes  you to lose hair.   Treatments may include regular checkups to make sure that the condition is not getting worse (watchful waiting), medicines, and steroid injections.  This information is not intended to replace advice given to you by your health care provider. Make sure you discuss any questions you have with your health care provider.  Document Released: 04/07/2004 Document Revised: 09/21/2016 Document Reviewed: 09/21/2016  Elsevier Interactive Patient Education  2018 [employer]

## 2017-04-04 NOTE — Progress Notes (Signed)
   Subjective:    Patient ID: April Schneider, female    DOB: September 16, 1933, 83 y.o.   MRN: [id number]  HPI Pt presents to the office today for hair loss and hair thinning in the back of her neck. PT states she noticed this after her levothyroxine was decreased to 50 mcg from 75 mcg.   Pt's thyroid panel on 03/22/17 was normal. Pt states she use to get her dyed, but has started doing home coloring over the last few months.    Review of Systems  Skin:       Hair thinning   All other systems reviewed and are negative.      Objective:   Physical Exam  Constitutional: She is oriented to person, place, and time. She appears well-developed and well-nourished. No distress.  HENT:  Head: Normocephalic.  Eyes: Pupils are equal, round, and reactive to light.  Neck: Normal range of motion. Neck supple. No thyromegaly present.  Cardiovascular: Normal rate, regular rhythm, normal heart sounds and intact distal pulses.   No murmur heard. Pulmonary/Chest: Effort normal and breath sounds normal. No respiratory distress. She has no wheezes.  Abdominal: Soft. Bowel sounds are normal. She exhibits no distension. There is no tenderness.  Musculoskeletal: Normal range of motion. She exhibits no edema or tenderness.  Neurological: She is alert and oriented to person, place, and time.  Skin: Skin is warm and dry.  Dry patch on lower neck with small amount of hair thinning  Psychiatric: She has a normal mood and affect. Her behavior is normal. Judgment and thought content normal.  Vitals reviewed.   BP (!) 131/56   Pulse (!) 58   Temp (!) 97.4 F (36.3 C) (Oral)   Ht 5' (1.524 m)   Wt 101 lb (45.8 kg)   BMI 19.73 kg/m       Assessment & Plan:  1. Hair thinning Could be related to home dying?  Thyroid panel normal Will do other labs  Do not pick or pull at hair RTO prn  - Anemia Profile B - BMP8+EGFR - VITAMIN D 25 Hydroxy (Vit-D Deficiency, Fractures)    Evelina Dun, FNP

## 2017-04-05 LAB — ANEMIA PROFILE B
BASOS ABS: 0.1 10*3/uL (ref 0.0–0.2)
BASOS: 1 %
EOS (ABSOLUTE): 0.2 10*3/uL (ref 0.0–0.4)
Eos: 3 %
FERRITIN: 20 ng/mL (ref 15–150)
HEMATOCRIT: 33.8 % — AB (ref 34.0–46.6)
Hemoglobin: 11.1 g/dL (ref 11.1–15.9)
Immature Grans (Abs): 0 10*3/uL (ref 0.0–0.1)
Immature Granulocytes: 0 %
Iron Saturation: 13 % — ABNORMAL LOW (ref 15–55)
Iron: 47 ug/dL (ref 27–139)
LYMPHS ABS: 1.9 10*3/uL (ref 0.7–3.1)
Lymphs: 28 %
MCH: 28.2 pg (ref 26.6–33.0)
MCHC: 32.8 g/dL (ref 31.5–35.7)
MCV: 86 fL (ref 79–97)
MONOCYTES: 6 %
Monocytes Absolute: 0.4 10*3/uL (ref 0.1–0.9)
NEUTROS ABS: 4.3 10*3/uL (ref 1.4–7.0)
Neutrophils: 62 %
PLATELETS: 222 10*3/uL (ref 150–379)
RBC: 3.93 x10E6/uL (ref 3.77–5.28)
RDW: 13.6 % (ref 12.3–15.4)
Retic Ct Pct: 0.5 % — ABNORMAL LOW (ref 0.6–2.6)
Total Iron Binding Capacity: 357 ug/dL (ref 250–450)
UIBC: 310 ug/dL (ref 118–369)
VITAMIN B 12: 543 pg/mL (ref 232–1245)
WBC: 7 10*3/uL (ref 3.4–10.8)

## 2017-04-05 LAB — BMP8+EGFR
BUN / CREAT RATIO: 15 (ref 12–28)
BUN: 15 mg/dL (ref 8–27)
CHLORIDE: 103 mmol/L (ref 96–106)
CO2: 24 mmol/L (ref 20–29)
Calcium: 9.7 mg/dL (ref 8.7–10.3)
Creatinine, Ser: 0.98 mg/dL (ref 0.57–1.00)
GFR calc non Af Amer: 54 mL/min/{1.73_m2} — ABNORMAL LOW (ref >59)
GFR, EST AFRICAN AMERICAN: 62 mL/min/{1.73_m2} (ref >59)
GLUCOSE: 95 mg/dL (ref 65–99)
POTASSIUM: 4.9 mmol/L (ref 3.5–5.2)
Sodium: 143 mmol/L (ref 134–144)

## 2017-04-05 LAB — VITAMIN D 25 HYDROXY (VIT D DEFICIENCY, FRACTURES): VIT D 25 HYDROXY: 40.7 ng/mL (ref 30.0–100.0)

## 2017-04-08 ENCOUNTER — Telehealth: Payer: Self-pay | Admitting: Family

## 2017-04-08 DIAGNOSIS — L989 Disorder of the skin and subcutaneous tissue, unspecified: Secondary | ICD-10-CM

## 2017-04-08 NOTE — Telephone Encounter (Signed)
What does she need a referral to dermatologists for?

## 2017-04-08 NOTE — Telephone Encounter (Signed)
What type of referral do you need? dermatologist  Have you been seen at our office for this problem? yes (If no, schedule them an appointment.  They will need to be seen before a referral can be done.)  Is there a particular doctor or location that you prefer? Someplace close. Or will the old medicine help her?  Patient notified that referrals can take up to a week or longer to process. If they haven't heard anything within a week they should call back and speak with the referral department.

## 2017-04-08 NOTE — Telephone Encounter (Signed)
Dermatologists referral placed

## 2017-04-08 NOTE — Telephone Encounter (Signed)
Skin lesion - on forehead - looks healed then keeps coming back  She says yall had talked about it at the last OV  It was a burn from curling iron a long time ago.  And hair loss and dry scalp .

## 2017-04-08 NOTE — Addendum Note (Signed)
Addended by: Evelina Dun A on: 04/08/2017 10:47 AM   Modules accepted: Orders

## 2017-04-16 ENCOUNTER — Other Ambulatory Visit: Payer: Self-pay | Admitting: Family

## 2017-04-25 DIAGNOSIS — L218 Other seborrheic dermatitis: Secondary | ICD-10-CM | POA: Diagnosis not present

## 2017-04-25 DIAGNOSIS — L650 Telogen effluvium: Secondary | ICD-10-CM | POA: Diagnosis not present

## 2017-04-25 DIAGNOSIS — C44319 Basal cell carcinoma of skin of other parts of face: Secondary | ICD-10-CM | POA: Diagnosis not present

## 2017-05-28 ENCOUNTER — Other Ambulatory Visit: Payer: Self-pay | Admitting: Family

## 2017-05-29 NOTE — Telephone Encounter (Signed)
Last seen 04/04/17  [facility]

## 2017-06-16 ENCOUNTER — Other Ambulatory Visit: Payer: Self-pay | Admitting: Family

## 2017-06-25 ENCOUNTER — Other Ambulatory Visit: Payer: Self-pay | Admitting: Family

## 2017-07-03 ENCOUNTER — Telehealth: Payer: Self-pay | Admitting: Family

## 2017-07-03 NOTE — Telephone Encounter (Signed)
FYI

## 2017-07-09 DIAGNOSIS — S0180XA Unspecified open wound of other part of head, initial encounter: Secondary | ICD-10-CM | POA: Diagnosis not present

## 2017-07-09 DIAGNOSIS — C44319 Basal cell carcinoma of skin of other parts of face: Secondary | ICD-10-CM | POA: Diagnosis not present

## 2017-07-09 DIAGNOSIS — Z85828 Personal history of other malignant neoplasm of skin: Secondary | ICD-10-CM | POA: Diagnosis not present

## 2017-07-15 ENCOUNTER — Ambulatory Visit: Payer: Medicare Other | Admitting: *Deleted

## 2017-07-18 ENCOUNTER — Ambulatory Visit (INDEPENDENT_AMBULATORY_CARE_PROVIDER_SITE_OTHER): Payer: Medicare Other | Admitting: Family

## 2017-07-18 ENCOUNTER — Encounter: Payer: Self-pay | Admitting: Family

## 2017-07-18 VITALS — BP 152/67 | HR 63 | Temp 97.2°F | Ht 60.0 in | Wt 104.8 lb

## 2017-07-18 DIAGNOSIS — H938X2 Other specified disorders of left ear: Secondary | ICD-10-CM | POA: Diagnosis not present

## 2017-07-18 NOTE — Patient Instructions (Signed)
Earwax Buildup, Adult The ears produce a substance called earwax that helps keep bacteria out of the ear and protects the skin in the ear canal. Occasionally, earwax can build up in the ear and cause discomfort or hearing loss. What increases the risk? This condition is more likely to develop in people who:  Are female.  Are elderly.  Naturally produce more earwax.  Clean their ears often with cotton swabs.  Use earplugs often.  Use in-ear headphones often.  Wear hearing aids.  Have narrow ear canals.  Have earwax that is overly thick or sticky.  Have eczema.  Are dehydrated.  Have excess hair in the ear canal.  What are the signs or symptoms? Symptoms of this condition include:  Reduced or muffled hearing.  A feeling of fullness in the ear or feeling that the ear is plugged.  Fluid coming from the ear.  Ear pain.  Ear itch.  Ringing in the ear.  Coughing.  An obvious piece of earwax that can be seen inside the ear canal.  How is this diagnosed? This condition may be diagnosed based on:  Your symptoms.  Your medical history.  An ear exam. During the exam, your health care provider will look into your ear with an instrument called an otoscope.  You may have tests, including a hearing test. How is this treated? This condition may be treated by:  Using ear drops to soften the earwax.  Having the earwax removed by a health care provider. The health care provider may: ? Flush the ear with water. ? Use an instrument that has a loop on the end (curette). ? Use a suction device.  Surgery to remove the wax buildup. This may be done in severe cases.  Follow these instructions at home:  Take over-the-counter and prescription medicines only as told by your health care provider.  Do not put any objects, including cotton swabs, into your ear. You can clean the opening of your ear canal with a washcloth or facial tissue.  Follow instructions from your health  care provider about cleaning your ears. Do not over-clean your ears.  Drink enough fluid to keep your urine clear or pale yellow. This will help to thin the earwax.  Keep all follow-up visits as told by your health care provider. If earwax builds up in your ears often or if you use hearing aids, consider seeing your health care provider for routine, preventive ear cleanings. Ask your health care provider how often you should schedule your cleanings.  If you have hearing aids, clean them according to instructions from the manufacturer and your health care provider. Contact a health care provider if:  You have ear pain.  You develop a fever.  You have blood, pus, or other fluid coming from your ear.  You have hearing loss.  You have ringing in your ears that does not go away.  Your symptoms do not improve with treatment.  You feel like the room is spinning (vertigo). Summary  Earwax can build up in the ear and cause discomfort or hearing loss.  The most common symptoms of this condition include reduced or muffled hearing and a feeling of fullness in the ear or feeling that the ear is plugged.  This condition may be diagnosed based on your symptoms, your medical history, and an ear exam.  This condition may be treated by using ear drops to soften the earwax or by having the earwax removed by a health care provider.  Do   not put any objects, including cotton swabs, into your ear. You can clean the opening of your ear canal with a washcloth or facial tissue. This information is not intended to replace advice given to you by your health care provider. Make sure you discuss any questions you have with your health care provider. Document Released: 10/11/2004 Document Revised: 11/14/2016 Document Reviewed: 11/14/2016 Elsevier Interactive Patient Education  2018 [employer]  

## 2017-07-18 NOTE — Progress Notes (Signed)
   Subjective:    Patient ID: April Schneider, female    DOB: [birth year], 83 y.o.   MRN: [id number]  Ear Fullness   There is pain in both ears. This is a new problem. The current episode started 1 to 4 weeks ago. The problem occurs constantly. The problem has been unchanged. There has been no fever. The pain is at a severity of 0/10. The patient is experiencing no pain. Pertinent negatives include no ear discharge, hearing loss or neck pain. She has tried ear drops for the symptoms. The treatment provided mild relief.      Review of Systems  HENT: Negative for ear discharge and hearing loss.   Musculoskeletal: Negative for neck pain.  Skin: Positive for wound.  All other systems reviewed and are negative.      Objective:   Physical Exam  Constitutional: She is oriented to person, place, and time. She appears well-developed and well-nourished. No distress.  HENT:  Head: Normocephalic and atraumatic.  Right Ear: External ear normal.  Left Ear: External ear normal.  Mouth/Throat: Oropharynx is clear and moist.  Mild cerumen in bilateral ears, TM visible, pt does not wish for [place] to remove cerumen today.   Cardiovascular: Normal rate, regular rhythm, normal heart sounds and intact distal pulses.   No murmur heard. Pulmonary/Chest: Effort normal and breath sounds normal. No respiratory distress. She has no wheezes.  Musculoskeletal: Normal range of motion. She exhibits no edema or tenderness.  Neurological: She is alert and oriented to person, place, and time.  Psychiatric: She has a normal mood and affect. Her behavior is normal. Judgment and thought content normal.  Vitals reviewed.   BP (!) 152/67   Pulse 63   Temp (!) 97.2 F (36.2 C) (Oral)   Ht 5' (1.524 m)   Wt 104 lb 12.8 oz (47.5 kg)   BMI 20.47 kg/m      Assessment & Plan:  1. Sensation of fullness in left ear Not much cerumen in ear, but patient like her ears cleaned by ENT OTC ear drops  Do not stick  anything into ear RTO prn  - Ambulatory referral to ENT   Evelina Dun, FNP

## 2017-07-20 ENCOUNTER — Other Ambulatory Visit: Payer: Self-pay | Admitting: Family

## 2017-07-22 NOTE — Telephone Encounter (Signed)
Last lipid 09/12/16   

## 2017-07-30 DIAGNOSIS — S0180XA Unspecified open wound of other part of head, initial encounter: Secondary | ICD-10-CM | POA: Diagnosis not present

## 2017-07-30 DIAGNOSIS — Z85828 Personal history of other malignant neoplasm of skin: Secondary | ICD-10-CM | POA: Diagnosis not present

## 2017-07-30 DIAGNOSIS — C44319 Basal cell carcinoma of skin of other parts of face: Secondary | ICD-10-CM | POA: Diagnosis not present

## 2017-08-12 ENCOUNTER — Ambulatory Visit: Payer: Medicare Other | Admitting: *Deleted

## 2017-08-13 DIAGNOSIS — Z4801 Encounter for change or removal of surgical wound dressing: Secondary | ICD-10-CM | POA: Diagnosis not present

## 2017-08-13 DIAGNOSIS — S0180XD Unspecified open wound of other part of head, subsequent encounter: Secondary | ICD-10-CM | POA: Diagnosis not present

## 2017-09-05 DIAGNOSIS — H6123 Impacted cerumen, bilateral: Secondary | ICD-10-CM | POA: Diagnosis not present

## 2017-09-14 ENCOUNTER — Other Ambulatory Visit: Payer: Self-pay | Admitting: Family

## 2017-09-19 DIAGNOSIS — S0100XD Unspecified open wound of scalp, subsequent encounter: Secondary | ICD-10-CM | POA: Diagnosis not present

## 2017-09-24 ENCOUNTER — Other Ambulatory Visit: Payer: Self-pay | Admitting: Family

## 2017-10-04 ENCOUNTER — Other Ambulatory Visit: Payer: Self-pay | Admitting: Family

## 2017-10-04 NOTE — Telephone Encounter (Signed)
Last lipid 09/12/16   

## 2017-12-03 ENCOUNTER — Other Ambulatory Visit: Payer: Self-pay | Admitting: Family

## 2017-12-20 ENCOUNTER — Other Ambulatory Visit: Payer: Self-pay | Admitting: Family

## 2018-01-02 ENCOUNTER — Other Ambulatory Visit: Payer: Self-pay | Admitting: Family

## 2018-01-02 NOTE — Telephone Encounter (Signed)
Last lipid 09/12/16   

## 2018-02-17 ENCOUNTER — Other Ambulatory Visit: Payer: Self-pay | Admitting: Family

## 2018-03-04 ENCOUNTER — Other Ambulatory Visit: Payer: Self-pay | Admitting: Family

## 2018-03-04 NOTE — Telephone Encounter (Signed)
Last seen 07/18/17

## 2018-04-01 ENCOUNTER — Other Ambulatory Visit: Payer: Self-pay | Admitting: Family

## 2018-04-01 NOTE — Telephone Encounter (Signed)
Patient aware, needs to be seen and have lab work for further refills.

## 2018-04-01 NOTE — Telephone Encounter (Signed)
Last lipid 09/12/16

## 2018-04-01 NOTE — Telephone Encounter (Signed)
Patient NTBS for follow up and lab work  

## 2018-05-01 ENCOUNTER — Other Ambulatory Visit: Payer: Self-pay | Admitting: Family

## 2018-05-01 NOTE — Telephone Encounter (Signed)
Last thyroid 03/22/17

## 2018-05-12 ENCOUNTER — Telehealth: Payer: Self-pay | Admitting: Family

## 2018-05-13 NOTE — Telephone Encounter (Signed)
Patient does not want to have an annual wellness visit at this time.  She wants to see [facility] for regular follow up. Appointment 06/02/18.

## 2018-05-16 ENCOUNTER — Other Ambulatory Visit: Payer: Self-pay | Admitting: Family

## 2018-05-20 NOTE — Telephone Encounter (Signed)
Last seen 07/18/17 and last Vit D 04/04/17  40.7

## 2018-06-02 ENCOUNTER — Encounter: Payer: Self-pay | Admitting: Family

## 2018-06-02 ENCOUNTER — Ambulatory Visit (INDEPENDENT_AMBULATORY_CARE_PROVIDER_SITE_OTHER): Payer: Medicare Other | Admitting: Family

## 2018-06-02 VITALS — BP 107/58 | HR 70 | Temp 97.8°F | Ht 60.0 in | Wt 103.2 lb

## 2018-06-02 DIAGNOSIS — K219 Gastro-esophageal reflux disease without esophagitis: Secondary | ICD-10-CM

## 2018-06-02 DIAGNOSIS — I10 Essential (primary) hypertension: Secondary | ICD-10-CM | POA: Diagnosis not present

## 2018-06-02 DIAGNOSIS — F411 Generalized anxiety disorder: Secondary | ICD-10-CM | POA: Diagnosis not present

## 2018-06-02 DIAGNOSIS — Z79899 Other long term (current) drug therapy: Secondary | ICD-10-CM | POA: Diagnosis not present

## 2018-06-02 DIAGNOSIS — E039 Hypothyroidism, unspecified: Secondary | ICD-10-CM

## 2018-06-02 DIAGNOSIS — E785 Hyperlipidemia, unspecified: Secondary | ICD-10-CM | POA: Diagnosis not present

## 2018-06-02 DIAGNOSIS — F1120 Opioid dependence, uncomplicated: Secondary | ICD-10-CM

## 2018-06-02 MED ORDER — ALPRAZOLAM 0.25 MG PO TABS: 0.2500 mg | ORAL_TABLET | Freq: Every evening | ORAL | 3 refills | Status: DC | PRN

## 2018-06-02 MED ORDER — ALPRAZOLAM 0.25 MG PO TABS: 0.2500 mg | ORAL_TABLET | Freq: Every evening | ORAL | 3 refills | Status: AC | PRN

## 2018-06-02 NOTE — Patient Instructions (Signed)

## 2018-06-02 NOTE — Addendum Note (Signed)
Addended by: Evelina Dun A on: 06/02/2018 04:09 PM   Modules accepted: Orders

## 2018-06-02 NOTE — Progress Notes (Signed)
Subjective:    Patient ID: April Schneider, female    DOB: 14-Jun-1933, 84 y.o.   MRN: [id number]  Chief Complaint  Patient presents with  . Medical Management of Chronic Issues    Anxiety  Presents for follow-up visit. Symptoms include irritability, nervous/anxious behavior and restlessness. Patient reports no chest pain, dizziness, excessive worry or shortness of breath. Symptoms occur most days. The severity of symptoms is mild. The quality of sleep is good.    Thyroid Problem  Presents for follow-up visit. Symptoms include anxiety and hoarse voice. Patient reports no diaphoresis or fatigue. The symptoms have been stable. Her past medical history is significant for hyperlipidemia. There is no history of heart failure.  Gastroesophageal Reflux  She complains of heartburn and a hoarse voice. She reports no belching, no chest pain or no coughing. This is a chronic problem. The current episode started more than 1 year ago. The problem occurs occasionally. The problem has been resolved. Pertinent negatives include no fatigue. She has tried an antacid for the symptoms. The treatment provided moderate relief.  Hypertension  This is a chronic problem. The current episode started more than 1 year ago. The problem has been resolved since onset. The problem is controlled. Associated symptoms include anxiety. Pertinent negatives include no chest pain, malaise/fatigue, peripheral edema or shortness of breath. The current treatment provides moderate improvement. There is no history of kidney disease, CAD/MI, CVA or heart failure. Identifiable causes of hypertension include a thyroid problem.  Hyperlipidemia  This is a chronic problem. The current episode started more than 1 year ago. The problem is controlled. Recent lipid tests were reviewed and are normal. Pertinent negatives include no chest pain or shortness of breath. Current antihyperlipidemic treatment includes statins. The current treatment  provides moderate improvement of lipids. Risk factors for coronary artery disease include dyslipidemia, hypertension, a sedentary lifestyle and post-menopausal.      Review of Systems  Constitutional: Positive for irritability. Negative for diaphoresis, fatigue and malaise/fatigue.  HENT: Positive for hoarse voice.   Respiratory: Negative for cough and shortness of breath.   Cardiovascular: Negative for chest pain.  Gastrointestinal: Positive for heartburn.  Neurological: Negative for dizziness.  Psychiatric/Behavioral: The patient is nervous/anxious.   All other systems reviewed and are negative.      Objective:   Physical Exam  Constitutional: She is oriented to person, place, and time. She appears well-developed and well-nourished. No distress.  HENT:  Head: Normocephalic and atraumatic.  Right Ear: External ear normal.  Left Ear: External ear normal.  Mouth/Throat: Oropharynx is clear and moist.  Eyes: Pupils are equal, round, and reactive to light.  Neck: Normal range of motion. Neck supple. No thyromegaly present.  Cardiovascular: Normal rate, regular rhythm, normal heart sounds and intact distal pulses.  No murmur heard. Pulmonary/Chest: Effort normal and breath sounds normal. No respiratory distress. She has no wheezes.  Abdominal: Soft. Bowel sounds are normal. She exhibits no distension. There is no tenderness.  Musculoskeletal: Normal range of motion. She exhibits no edema or tenderness.  Neurological: She is alert and oriented to person, place, and time. She has normal reflexes. No cranial nerve deficit.  Skin: Skin is warm and dry.  Psychiatric: She has a normal mood and affect. Her behavior is normal. Judgment and thought content normal.  Vitals reviewed.     BP (!) 107/58   Pulse 70   Temp 97.8 F (36.6 C) (Oral)   Ht 5' (1.524 m)   Wt 103 lb  3.2 oz (46.8 kg)   BMI 20.15 kg/m      Assessment & Plan:  April Schneider comes in today with chief  complaint of Medical Management of Chronic Issues   Diagnosis and orders addressed:  1. GAD (generalized anxiety disorder) - CMP14+EGFR - CBC with Differential/Platelet - ToxASSURE Select 13 (MW), Urine  2. Gastroesophageal reflux disease, esophagitis presence not specified - CMP14+EGFR - CBC with Differential/Platelet  3. Hyperlipidemia, unspecified hyperlipidemia type - CMP14+EGFR - CBC with Differential/Platelet - Lipid panel  4. Hypothyroidism, unspecified type - CMP14+EGFR - CBC with Differential/Platelet - TSH  5. Essential hypertension -Will stop Norvasc 5 mg today - CMP14+EGFR - CBC with Differential/Platelet  6. Uncomplicated opioid dependence ([place]) - CMP14+EGFR - CBC with Differential/Platelet - ToxASSURE Select 13 (MW), Urine  7. Controlled substance agreement signed - CMP14+EGFR - CBC with Differential/Platelet - ToxASSURE Select 13 (MW), Urine   Labs pending Health Maintenance reviewed Diet and exercise encouraged  Follow up plan: 2 weeks    Evelina Dun, FNP

## 2018-06-03 ENCOUNTER — Other Ambulatory Visit: Payer: Self-pay | Admitting: Family

## 2018-06-03 LAB — CBC WITH DIFFERENTIAL/PLATELET
BASOS ABS: 0.1 10*3/uL (ref 0.0–0.2)
Basos: 1 %
EOS (ABSOLUTE): 0.3 10*3/uL (ref 0.0–0.4)
EOS: 4 %
HEMATOCRIT: 36.8 % (ref 34.0–46.6)
Hemoglobin: 12.2 g/dL (ref 11.1–15.9)
IMMATURE GRANULOCYTES: 0 %
Immature Grans (Abs): 0 10*3/uL (ref 0.0–0.1)
Lymphocytes Absolute: 1.7 10*3/uL (ref 0.7–3.1)
Lymphs: 23 %
MCH: 29.4 pg (ref 26.6–33.0)
MCHC: 33.2 g/dL (ref 31.5–35.7)
MCV: 89 fL (ref 79–97)
MONOS ABS: 0.6 10*3/uL (ref 0.1–0.9)
Monocytes: 8 %
Neutrophils Absolute: 4.8 10*3/uL (ref 1.4–7.0)
Neutrophils: 64 %
PLATELETS: 228 10*3/uL (ref 150–450)
RBC: 4.15 x10E6/uL (ref 3.77–5.28)
RDW: 12 % — AB (ref 12.3–15.4)
WBC: 7.5 10*3/uL (ref 3.4–10.8)

## 2018-06-03 LAB — CMP14+EGFR
ALK PHOS: 75 IU/L (ref 39–117)
ALT: 11 IU/L (ref 0–32)
AST: 21 IU/L (ref 0–40)
Albumin/Globulin Ratio: 2.4 — ABNORMAL HIGH (ref 1.2–2.2)
Albumin: 4.6 g/dL (ref 3.5–4.7)
BUN/Creatinine Ratio: 16 (ref 12–28)
BUN: 16 mg/dL (ref 8–27)
Bilirubin Total: 0.4 mg/dL (ref 0.0–1.2)
CHLORIDE: 102 mmol/L (ref 96–106)
CO2: 26 mmol/L (ref 20–29)
CREATININE: 1.01 mg/dL — AB (ref 0.57–1.00)
Calcium: 9.8 mg/dL (ref 8.7–10.3)
GFR calc Af Amer: 59 mL/min/{1.73_m2} — ABNORMAL LOW (ref >59)
GFR calc non Af Amer: 52 mL/min/{1.73_m2} — ABNORMAL LOW (ref >59)
GLOBULIN, TOTAL: 1.9 g/dL (ref 1.5–4.5)
GLUCOSE: 81 mg/dL (ref 65–99)
Potassium: 4.5 mmol/L (ref 3.5–5.2)
SODIUM: 143 mmol/L (ref 134–144)
Total Protein: 6.5 g/dL (ref 6.0–8.5)

## 2018-06-03 LAB — LIPID PANEL
CHOLESTEROL TOTAL: 161 mg/dL (ref 100–199)
Chol/HDL Ratio: 2.4 ratio (ref 0.0–4.4)
HDL: 67 mg/dL (ref >39)
LDL Calculated: 81 mg/dL (ref 0–99)
TRIGLYCERIDES: 67 mg/dL (ref 0–149)
VLDL Cholesterol Cal: 13 mg/dL (ref 5–40)

## 2018-06-03 LAB — TSH: TSH: 2.13 u[IU]/mL (ref 0.450–4.500)

## 2018-06-06 LAB — TOXASSURE SELECT 13 (MW), URINE

## 2018-07-02 ENCOUNTER — Other Ambulatory Visit: Payer: Self-pay | Admitting: Family

## 2018-09-13 DIAGNOSIS — R4020 Unspecified coma: Secondary | ICD-10-CM | POA: Diagnosis not present

## 2018-09-13 DIAGNOSIS — R410 Disorientation, unspecified: Secondary | ICD-10-CM | POA: Diagnosis not present

## 2018-09-13 DIAGNOSIS — I10 Essential (primary) hypertension: Secondary | ICD-10-CM | POA: Diagnosis not present

## 2018-09-15 ENCOUNTER — Telehealth: Payer: Self-pay | Admitting: Family

## 2018-09-15 NOTE — Telephone Encounter (Signed)
Appointment scheduled.

## 2018-09-22 ENCOUNTER — Ambulatory Visit: Payer: Medicare Other | Admitting: Family

## 2018-11-21 ENCOUNTER — Other Ambulatory Visit: Payer: Self-pay | Admitting: Family

## 2018-12-17 ENCOUNTER — Other Ambulatory Visit: Payer: Self-pay

## 2018-12-17 ENCOUNTER — Other Ambulatory Visit: Payer: Self-pay | Admitting: Family

## 2018-12-17 NOTE — Telephone Encounter (Signed)
Last seen 06/02/18

## 2018-12-18 NOTE — Telephone Encounter (Signed)
What medication does patient need?

## 2019-01-05 ENCOUNTER — Other Ambulatory Visit: Payer: Self-pay | Admitting: Family

## 2019-01-29 ENCOUNTER — Other Ambulatory Visit: Payer: Self-pay | Admitting: Family Medicine

## 2019-01-30 NOTE — Telephone Encounter (Signed)
Dettinger. NTBS 30 days given 01/06/19

## 2019-02-03 ENCOUNTER — Telehealth: Payer: Self-pay | Admitting: Family

## 2019-03-27 ENCOUNTER — Other Ambulatory Visit: Payer: Self-pay | Admitting: Family

## 2019-05-05 ENCOUNTER — Other Ambulatory Visit: Payer: Self-pay | Admitting: Family

## 2019-05-27 ENCOUNTER — Other Ambulatory Visit: Payer: Self-pay | Admitting: Family

## 2019-06-18 DEATH — deceased
